# Patient Record
Sex: Male | Born: 1965 | Race: Black or African American | Hispanic: No | Marital: Single | Smoking: Current some day smoker
Health system: Southern US, Community
[De-identification: ages and names within clinical notes are randomized; demographics above are authoritative.]

## PROBLEM LIST (undated history)

## (undated) DIAGNOSIS — IMO0002 Reserved for concepts with insufficient information to code with codable children: Secondary | ICD-10-CM

## (undated) DIAGNOSIS — E119 Type 2 diabetes mellitus without complications: Secondary | ICD-10-CM

## (undated) DIAGNOSIS — R569 Unspecified convulsions: Secondary | ICD-10-CM

## (undated) DIAGNOSIS — M549 Dorsalgia, unspecified: Secondary | ICD-10-CM

## (undated) DIAGNOSIS — I10 Essential (primary) hypertension: Secondary | ICD-10-CM

---
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
## 1998-12-13 ENCOUNTER — Encounter: Payer: Self-pay | Admitting: Emergency Medicine

## 1998-12-13 ENCOUNTER — Emergency Department (HOSPITAL_COMMUNITY): Admission: EM | Admit: 1998-12-13 | Discharge: 1998-12-13 | Payer: Self-pay | Admitting: Emergency Medicine

## 2001-12-18 ENCOUNTER — Encounter: Payer: Self-pay | Admitting: Emergency Medicine

## 2001-12-18 ENCOUNTER — Emergency Department (HOSPITAL_COMMUNITY): Admission: EM | Admit: 2001-12-18 | Discharge: 2001-12-18 | Payer: Self-pay | Admitting: Emergency Medicine

## 2003-12-23 ENCOUNTER — Emergency Department (HOSPITAL_COMMUNITY): Admission: EM | Admit: 2003-12-23 | Discharge: 2003-12-23 | Payer: Self-pay

## 2009-09-02 ENCOUNTER — Emergency Department (HOSPITAL_COMMUNITY): Admission: EM | Admit: 2009-09-02 | Discharge: 2009-09-02 | Payer: Self-pay | Admitting: Emergency Medicine

## 2010-02-27 ENCOUNTER — Emergency Department (HOSPITAL_COMMUNITY): Admission: EM | Admit: 2010-02-27 | Discharge: 2010-02-27 | Payer: Self-pay | Admitting: Emergency Medicine

## 2010-03-06 ENCOUNTER — Emergency Department (HOSPITAL_COMMUNITY): Admission: EM | Admit: 2010-03-06 | Discharge: 2010-03-06 | Payer: Self-pay | Admitting: Family Medicine

## 2010-03-11 ENCOUNTER — Other Ambulatory Visit: Payer: Self-pay | Admitting: Orthopedic Surgery

## 2010-03-11 ENCOUNTER — Ambulatory Visit (HOSPITAL_COMMUNITY): Admission: RE | Admit: 2010-03-11 | Discharge: 2010-03-11 | Payer: Self-pay | Admitting: Orthopedic Surgery

## 2010-03-14 ENCOUNTER — Emergency Department (HOSPITAL_COMMUNITY): Admission: EM | Admit: 2010-03-14 | Discharge: 2010-03-14 | Payer: Self-pay | Admitting: Emergency Medicine

## 2010-03-25 ENCOUNTER — Emergency Department (HOSPITAL_COMMUNITY): Admission: EM | Admit: 2010-03-25 | Discharge: 2010-03-25 | Payer: Self-pay | Admitting: Emergency Medicine

## 2010-03-28 ENCOUNTER — Observation Stay (HOSPITAL_COMMUNITY): Admission: EM | Admit: 2010-03-28 | Discharge: 2010-03-29 | Payer: Self-pay | Admitting: Emergency Medicine

## 2010-04-01 ENCOUNTER — Inpatient Hospital Stay (HOSPITAL_COMMUNITY): Admission: AD | Admit: 2010-04-01 | Discharge: 2010-04-04 | Payer: Self-pay | Admitting: Internal Medicine

## 2010-04-09 ENCOUNTER — Observation Stay (HOSPITAL_COMMUNITY): Admission: EM | Admit: 2010-04-09 | Discharge: 2010-04-09 | Payer: Self-pay | Admitting: Emergency Medicine

## 2010-07-21 ENCOUNTER — Emergency Department (HOSPITAL_COMMUNITY)
Admission: EM | Admit: 2010-07-21 | Discharge: 2010-07-21 | Payer: Self-pay | Source: Home / Self Care | Admitting: Emergency Medicine

## 2010-07-31 ENCOUNTER — Emergency Department (HOSPITAL_COMMUNITY)
Admission: EM | Admit: 2010-07-31 | Discharge: 2010-07-31 | Payer: Self-pay | Source: Home / Self Care | Admitting: Emergency Medicine

## 2010-08-06 ENCOUNTER — Emergency Department (HOSPITAL_COMMUNITY)
Admission: EM | Admit: 2010-08-06 | Discharge: 2010-08-06 | Payer: Self-pay | Source: Home / Self Care | Admitting: Family Medicine

## 2010-10-24 LAB — CBC
HCT: 44.2 % (ref 39.0–52.0)
MCH: 34.6 pg — ABNORMAL HIGH (ref 26.0–34.0)
MCHC: 34.5 g/dL (ref 30.0–36.0)
MCHC: 33.8 g/dL (ref 30.0–36.0)
MCV: 100.2 fL — ABNORMAL HIGH (ref 78.0–100.0)
MCV: 102.3 fL — ABNORMAL HIGH (ref 78.0–100.0)
Platelets: 202 10*3/uL (ref 150–400)
Platelets: 223 10*3/uL (ref 150–400)
RBC: 4.13 MIL/uL — ABNORMAL LOW (ref 4.22–5.81)
RDW: 12.4 % (ref 11.5–15.5)
RDW: 13.3 % (ref 11.5–15.5)
WBC: 6.9 10*3/uL (ref 4.0–10.5)

## 2010-10-24 LAB — COMPREHENSIVE METABOLIC PANEL
Albumin: 3.5 g/dL (ref 3.5–5.2)
Calcium: 8.8 mg/dL (ref 8.4–10.5)
Creatinine, Ser: 1.01 mg/dL (ref 0.4–1.5)
GFR calc Af Amer: >60 mL/min (ref >60)
Glucose, Bld: 193 mg/dL — ABNORMAL HIGH (ref 70–99)
Potassium: 4.1 mEq/L (ref 3.5–5.1)
Sodium: 138 mEq/L (ref 135–145)
Total Bilirubin: 0.8 mg/dL (ref 0.3–1.2)
Total Protein: 6.2 g/dL (ref 6.0–8.3)

## 2010-10-24 LAB — BASIC METABOLIC PANEL
BUN: 18 mg/dL (ref 6–23)
GFR calc non Af Amer: >60 mL/min (ref >60)
Sodium: 139 mEq/L (ref 135–145)

## 2010-10-24 LAB — GLUCOSE, CAPILLARY: Glucose-Capillary: 93 mg/dL (ref 70–99)

## 2010-11-10 ENCOUNTER — Encounter: Payer: Self-pay | Attending: Physical Medicine & Rehabilitation | Admitting: Physical Medicine & Rehabilitation

## 2011-04-14 ENCOUNTER — Encounter: Payer: Self-pay | Attending: Neurosurgery | Admitting: Neurosurgery

## 2011-05-11 ENCOUNTER — Ambulatory Visit: Payer: Self-pay | Admitting: Physical Medicine & Rehabilitation

## 2011-06-24 ENCOUNTER — Emergency Department (HOSPITAL_COMMUNITY)
Admission: EM | Admit: 2011-06-24 | Discharge: 2011-06-24 | Disposition: A | Discharge status: Home / Self Care | Payer: Self-pay | Attending: Emergency Medicine | Admitting: Emergency Medicine

## 2011-06-24 ENCOUNTER — Encounter: Payer: Self-pay | Admitting: *Deleted

## 2011-06-24 DIAGNOSIS — S0180XA Unspecified open wound of other part of head, initial encounter: Secondary | ICD-10-CM | POA: Insufficient documentation

## 2011-06-24 DIAGNOSIS — T148XXA Other injury of unspecified body region, initial encounter: Secondary | ICD-10-CM

## 2011-06-24 DIAGNOSIS — S0181XA Laceration without foreign body of other part of head, initial encounter: Secondary | ICD-10-CM

## 2011-06-24 DIAGNOSIS — IMO0002 Reserved for concepts with insufficient information to code with codable children: Secondary | ICD-10-CM | POA: Insufficient documentation

## 2011-06-24 HISTORY — DX: Dorsalgia, unspecified: M54.9

## 2011-06-24 MED ORDER — OXYCODONE-ACETAMINOPHEN 5-325 MG PO TABS
1.0000 | ORAL_TABLET | Freq: Once | ORAL | Status: AC
  Administered 2011-06-24: 1 via ORAL

## 2011-06-24 MED ORDER — OXYCODONE-ACETAMINOPHEN 5-325 MG PO TABS: 1.0000 | ORAL_TABLET | ORAL | Status: AC | PRN

## 2011-06-24 MED ORDER — TETANUS-DIPHTH-ACELL PERTUSSIS 5-2.5-18.5 LF-MCG/0.5 IM SUSP
0.5000 mL | Freq: Once | INTRAMUSCULAR | Status: AC
  Administered 2011-06-24: 0.5 mL via INTRAMUSCULAR
  Filled 2011-06-24: qty 0.5

## 2011-06-24 MED ORDER — OXYCODONE-ACETAMINOPHEN 5-325 MG PO TABS
ORAL_TABLET | ORAL | Status: AC
  Filled 2011-06-24: qty 1
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
## 2011-06-24 NOTE — ED Notes (Signed)
EDP at bedside suturing laceration ? ?
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
## 2011-06-24 NOTE — ED Notes (Signed)
Please call [employer] officer Sturm at [phone] if "it is more then just stitches"
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
## 2011-06-29 ENCOUNTER — Emergency Department (HOSPITAL_COMMUNITY)
Admission: EM | Admit: 2011-06-29 | Discharge: 2011-06-29 | Disposition: A | Discharge status: Home / Self Care | Payer: Self-pay | Attending: Emergency Medicine | Admitting: Emergency Medicine

## 2011-06-29 ENCOUNTER — Encounter (HOSPITAL_COMMUNITY): Payer: Self-pay | Admitting: *Deleted

## 2011-06-29 DIAGNOSIS — IMO0002 Reserved for concepts with insufficient information to code with codable children: Secondary | ICD-10-CM

## 2011-06-29 DIAGNOSIS — Z4802 Encounter for removal of sutures: Secondary | ICD-10-CM | POA: Insufficient documentation

## 2011-06-29 MED ORDER — IBUPROFEN 800 MG PO TABS: 800.0000 mg | ORAL_TABLET | Freq: Three times a day (TID) | ORAL | Status: AC

## 2011-06-29 MED ORDER — HYDROCODONE-ACETAMINOPHEN 5-325 MG PO TABS: 1.0000 | ORAL_TABLET | Freq: Four times a day (QID) | ORAL | Status: AC | PRN

## 2011-06-29 NOTE — ED Notes (Signed)
To ed for suture removal from above left eyebrow. Pt states he has had white drainage from site. No redness noted.

## 2011-06-29 NOTE — ED Provider Notes (Signed)
Medical screening examination/treatment/procedure(s) were performed by non-physician practitioner and as supervising physician I was immediately available for consultation/collaboration.  Raeford Razor, MD 06/29/11 2212
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
## 2011-10-07 IMAGING — CR DG HAND COMPLETE 3+V*R*
3 series · 3 of 3 positions shown · non-contrast
Comparison: None.

CLINICAL DATA: Hand injury post fall

RIGHT HAND - COMPLETE 3+ VIEW

[view not recorded (1 of 3)]
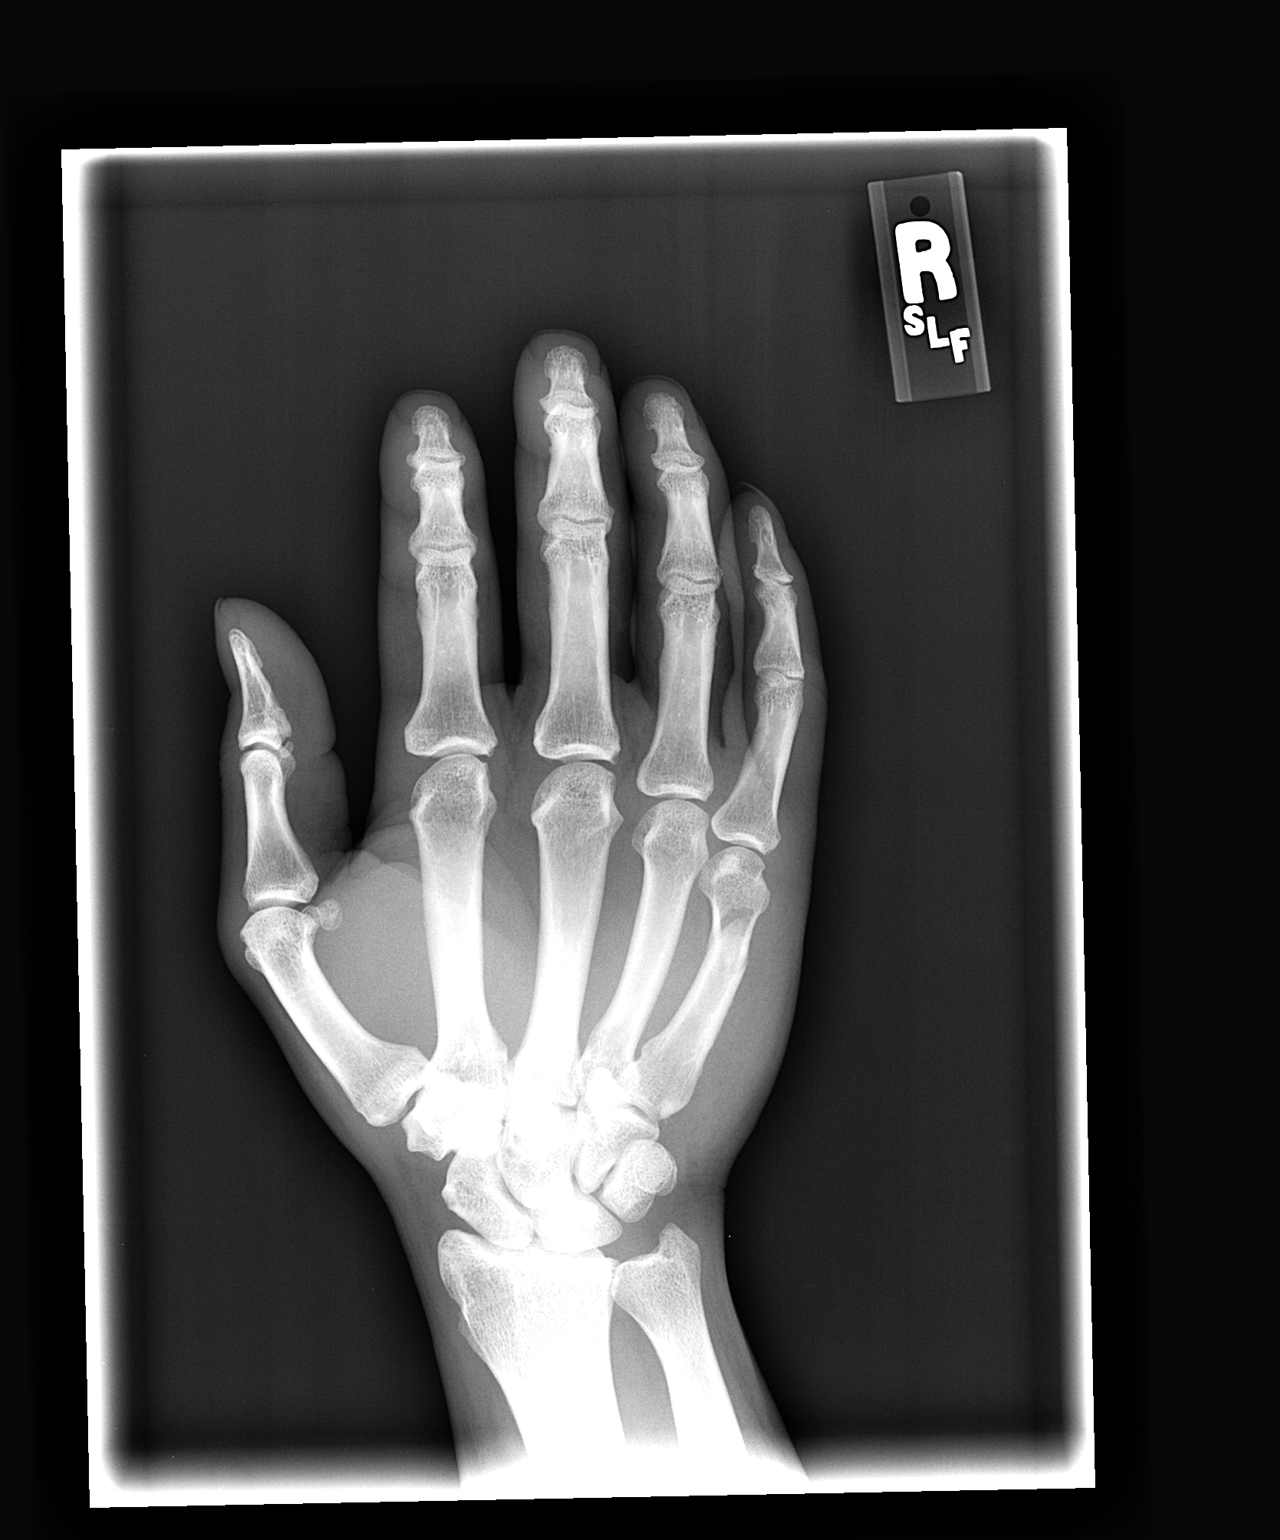

[view not recorded (2 of 3)]
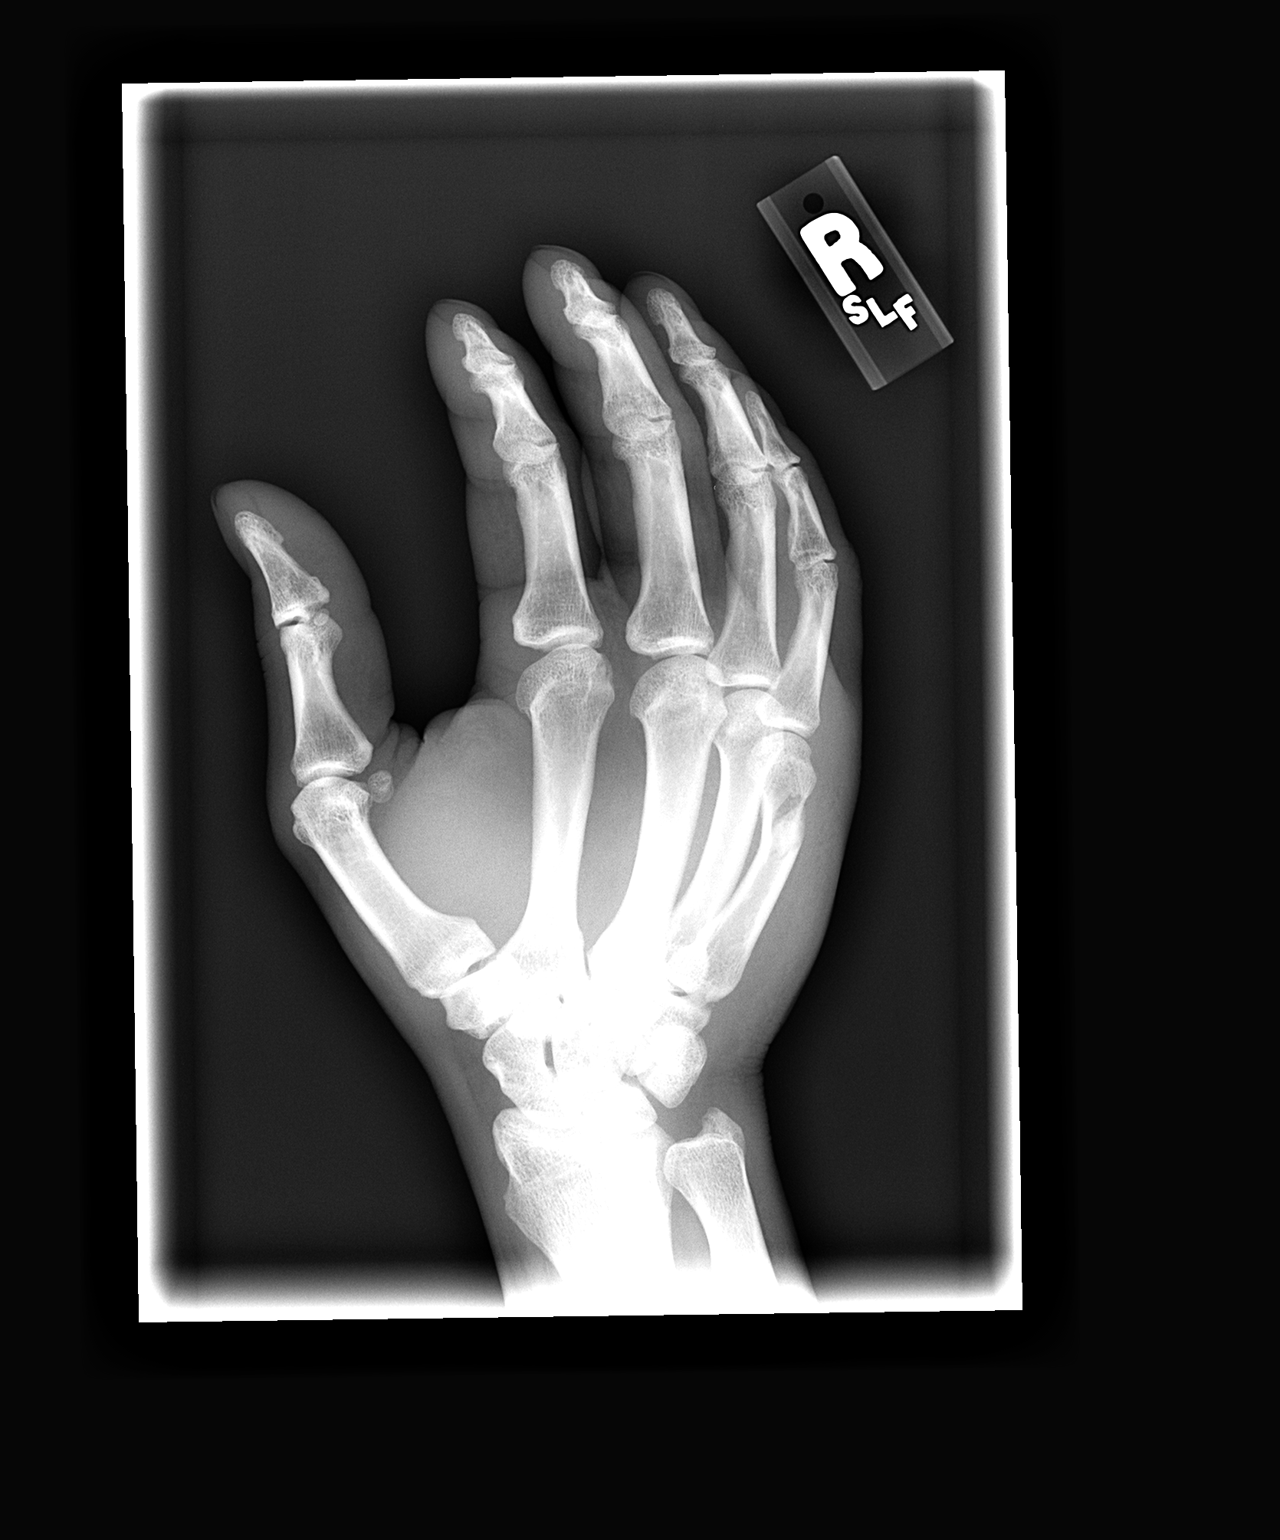

[view not recorded (3 of 3)]
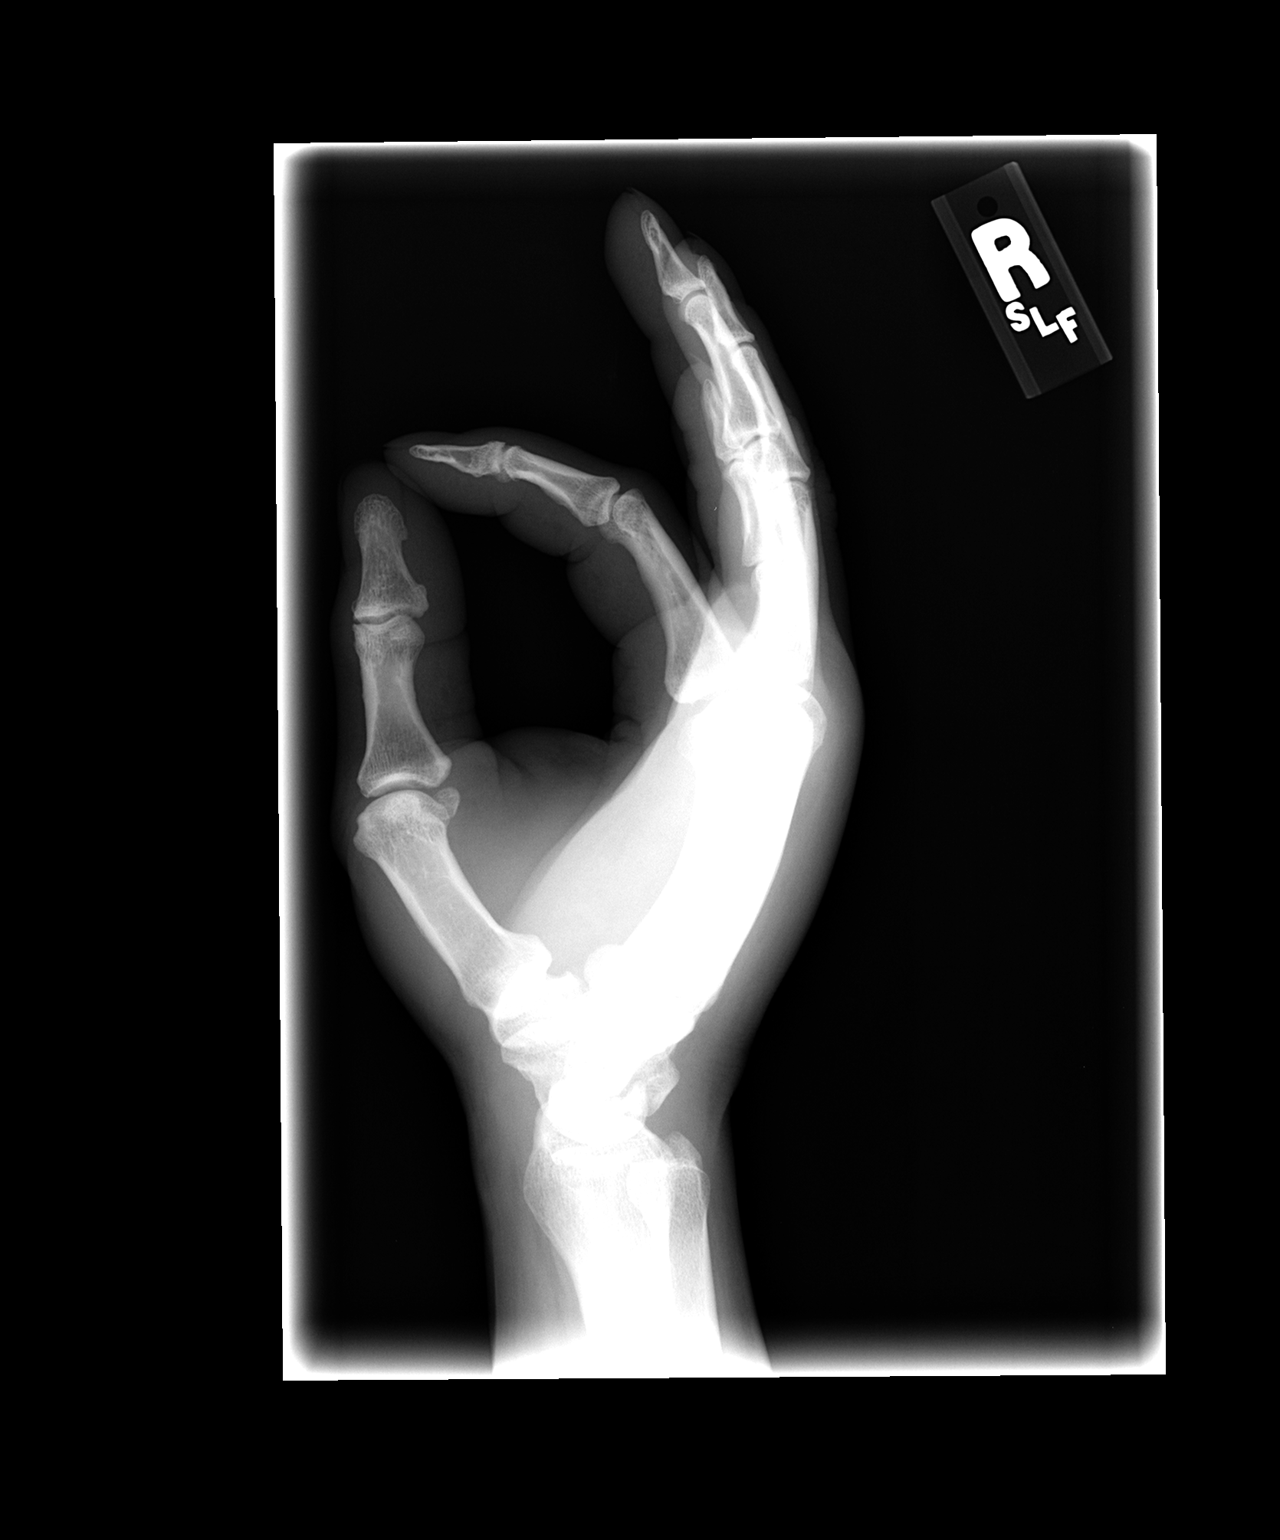

[3 of 3 positions shown; findings below may reference images not displayed]

FINDINGS: Three views of the right hand submitted.  There is mild
displaced fracture of the distal shaft right fifth metacarpal.
IMPRESSION: Mild displaced fracture distal shaft right fifth metacarpal.

## 2012-05-18 ENCOUNTER — Emergency Department (HOSPITAL_COMMUNITY)
Admission: EM | Admit: 2012-05-18 | Discharge: 2012-05-18 | Disposition: A | Discharge status: Home / Self Care | Payer: Self-pay | Attending: Emergency Medicine | Admitting: Emergency Medicine

## 2012-05-18 ENCOUNTER — Encounter (HOSPITAL_COMMUNITY): Payer: Self-pay | Admitting: Emergency Medicine

## 2012-05-18 DIAGNOSIS — Z880 Allergy status to penicillin: Secondary | ICD-10-CM | POA: Insufficient documentation

## 2012-05-18 DIAGNOSIS — G8929 Other chronic pain: Secondary | ICD-10-CM | POA: Insufficient documentation

## 2012-05-18 DIAGNOSIS — M5126 Other intervertebral disc displacement, lumbar region: Secondary | ICD-10-CM | POA: Insufficient documentation

## 2012-05-18 DIAGNOSIS — I10 Essential (primary) hypertension: Secondary | ICD-10-CM | POA: Insufficient documentation

## 2012-05-18 HISTORY — DX: Reserved for concepts with insufficient information to code with codable children: IMO0002

## 2012-05-18 HISTORY — DX: Essential (primary) hypertension: I10

## 2012-05-18 MED ORDER — DIAZEPAM 5 MG PO TABS: 5.0000 mg | ORAL_TABLET | Freq: Four times a day (QID) | ORAL | Status: DC | PRN

## 2012-05-18 MED ORDER — OXYCODONE-ACETAMINOPHEN 5-325 MG PO TABS
2.0000 | ORAL_TABLET | Freq: Once | ORAL | Status: AC
  Administered 2012-05-18: 2 via ORAL
  Filled 2012-05-18: qty 2

## 2012-05-18 MED ORDER — DIAZEPAM 5 MG PO TABS
5.0000 mg | ORAL_TABLET | Freq: Once | ORAL | Status: AC
  Administered 2012-05-18: 5 mg via ORAL
  Filled 2012-05-18: qty 1

## 2012-05-18 MED ORDER — HYDROMORPHONE HCL PF 2 MG/ML IJ SOLN: 2.0000 mg | Freq: Once | INTRAMUSCULAR | Status: DC

## 2012-05-18 MED ORDER — OXYCODONE-ACETAMINOPHEN 10-325 MG PO TABS: 1.0000 | ORAL_TABLET | ORAL | Status: DC | PRN

## 2012-05-18 NOTE — ED Provider Notes (Signed)
History     CSN: [id number]  Arrival date & time 05/18/12  0230   First MD Initiated Contact with Patient 05/18/12 0301      Chief Complaint  Patient presents with  . Back Pain   HPI  History provided by the patient. Patient is a 46 year old African American male with history of hypertension, herniated lumbar discs and chronic back pain who presents with complaints of chronic back pain symptoms. Patient states he's been dealing with low back pain for the past 2-3 years. He was a patient ulcer of with Dr. Mayford Knife and was seen by spine specialist in [place]. She was previously being treated with pain medications but he states he ran out several weeks ago. Patient has been using over-the-counter pain medications and occasionally using stronger pain medicines from his mother at times. Patient states yesterday he was spending some time raking leaves which seemed to increase his pain tonight thinking it difficult to sleep. Pain pain radiates down left lower extremity at baseline. Patient reports having some numbness to the lower left leg area that has been chronic. Patient also reports occasional episodic history of urinary incontinence. He denies any recent incontinence in the last several weeks. He denies any weakness in the legs. Denies any perineal numbness. Denies any urinary retention.    Past Medical History  Diagnosis Date  . Back pain   . Hypertension   . Herniated disc     History reviewed. No pertinent past surgical history.  No family history on file.  History  Substance Use Topics  . Smoking status: Never Smoker   . Smokeless tobacco: Never Used  . Alcohol Use: Yes      Review of Systems  Constitutional: Negative for fever, chills and unexpected weight change.  Respiratory: Negative for shortness of breath.   Gastrointestinal: Negative for vomiting, abdominal pain, diarrhea and constipation.  Genitourinary: Negative for dysuria, frequency, hematuria and flank  pain.  Musculoskeletal: Positive for back pain.  Skin: Negative for rash.  Neurological: Positive for numbness. Negative for weakness.    Allergies  Penicillins  Home Medications  No current outpatient prescriptions on file.  BP 140/114  Pulse 104  Temp 98.9 F (37.2 C) (Oral)  Resp 16  SpO2 94%  Physical Exam  Nursing note and vitals reviewed. Constitutional: He is oriented to person, place, and time. He appears well-developed and well-nourished.  HENT:  Head: Normocephalic.  Cardiovascular: Normal rate and regular rhythm.   Pulmonary/Chest: Effort normal and breath sounds normal.  Abdominal: Soft. There is no tenderness. There is no rebound, no guarding and no CVA tenderness.  Musculoskeletal:       Cervical back: Normal.       Thoracic back: Normal.       Lumbar back: He exhibits decreased range of motion and tenderness.       Back:  Neurological: He is alert and oriented to person, place, and time. He has normal strength.       Decreased sensation to light touch over anterior left shin area and great toe.  Normal sensation to light touch in 2-5 left toes.  Normal strength in foot and ankle.  Skin: Skin is warm.  Psychiatric: He has a normal mood and affect. His behavior is normal.    ED Course  Procedures     1. Chronic back pain       MDM  3:00AM  patient seen and evaluated. Patient appears in moderate discomfort. Symptoms have been chronic without any  significant acute changes.  Patient does have concerning past history of intermittent episodes of incontinence for the past year. No recent episodes in the last several weeks. I discussed with patient options for MRI and he refused. I discussed strong recommendations for the patient to followup with his spine specialist in [place] or to see a spine specialist here in [place]. I will provide referral at discharge. We'll also provide prescriptions for Percocet 10 mg and Valium 5 mg last for the next few  days.        Angus Seller, [place] 05/18/12 [phone]

## 2012-05-18 NOTE — ED Notes (Signed)
ED PA at BS 

## 2012-05-18 NOTE — ED Notes (Addendum)
PT. REPORTS CHRONIC LOW BACK PAIN FOR 2 1/2 YEARS , DENIES INJURY OR FALL , AMBULATORY , STATES HE RAN OUT OF PERCOCET AND VALIUM FOR SEVERAL DAYS , HISTORY OF HERNIATED DISC.

## 2012-05-18 NOTE — ED Notes (Signed)
Pt discharge to home.  Prescriptions given to pt to get filled.  Pt states much relief to meds given here.  Pt will f/u with pcp.  NAD

## 2012-05-18 NOTE — ED Notes (Addendum)
C/o L lower back pain, radiates down L leg. "have numbness all the time in L leg". Sx ongoing for last 3 yrs. Last episode ~ 3 months ago. Last MRI ~ 1 yr ago. Specialist in [place]. (Denies: weakness, nvd, fever). Sx exacerbated yesterday raking leaves. States, "has urinary frequency he thinks r/t hypoglycemia", admits to urinary and bowel incontinence several times, about twice a week.

## 2012-05-22 NOTE — ED Provider Notes (Signed)
Medical screening examination/treatment/procedure(s) were performed by non-physician practitioner and as supervising physician I was immediately available for consultation/collaboration.  Llesenia Fogal T Joycelin Radloff, MD 05/22/12 0857 

## 2012-07-22 ENCOUNTER — Emergency Department (HOSPITAL_COMMUNITY)
Admission: EM | Admit: 2012-07-22 | Discharge: 2012-07-22 | Disposition: A | Discharge status: Home / Self Care | Payer: No Typology Code available for payment source | Attending: Emergency Medicine | Admitting: Emergency Medicine

## 2012-07-22 ENCOUNTER — Emergency Department (HOSPITAL_COMMUNITY): Payer: No Typology Code available for payment source

## 2012-07-22 ENCOUNTER — Encounter (HOSPITAL_COMMUNITY): Payer: Self-pay | Admitting: Emergency Medicine

## 2012-07-22 DIAGNOSIS — M519 Unspecified thoracic, thoracolumbar and lumbosacral intervertebral disc disorder: Secondary | ICD-10-CM

## 2012-07-22 DIAGNOSIS — M5146 Schmorl's nodes, lumbar region: Secondary | ICD-10-CM | POA: Insufficient documentation

## 2012-07-22 DIAGNOSIS — R5381 Other malaise: Secondary | ICD-10-CM | POA: Insufficient documentation

## 2012-07-22 DIAGNOSIS — I10 Essential (primary) hypertension: Secondary | ICD-10-CM | POA: Insufficient documentation

## 2012-07-22 DIAGNOSIS — M545 Low back pain, unspecified: Secondary | ICD-10-CM | POA: Insufficient documentation

## 2012-07-22 DIAGNOSIS — R209 Unspecified disturbances of skin sensation: Secondary | ICD-10-CM | POA: Insufficient documentation

## 2012-07-22 DIAGNOSIS — M79609 Pain in unspecified limb: Secondary | ICD-10-CM | POA: Insufficient documentation

## 2012-07-22 DIAGNOSIS — M549 Dorsalgia, unspecified: Secondary | ICD-10-CM

## 2012-07-22 DIAGNOSIS — M48061 Spinal stenosis, lumbar region without neurogenic claudication: Secondary | ICD-10-CM | POA: Insufficient documentation

## 2012-07-22 DIAGNOSIS — M4800 Spinal stenosis, site unspecified: Secondary | ICD-10-CM

## 2012-07-22 DIAGNOSIS — R3919 Other difficulties with micturition: Secondary | ICD-10-CM | POA: Insufficient documentation

## 2012-07-22 MED ORDER — SENNOSIDES-DOCUSATE SODIUM 8.6-50 MG PO TABS: 1.0000 | ORAL_TABLET | Freq: Every day | ORAL | Status: DC

## 2012-07-22 MED ORDER — OXYCODONE-ACETAMINOPHEN 10-325 MG PO TABS: 1.0000 | ORAL_TABLET | ORAL | Status: DC | PRN

## 2012-07-22 MED ORDER — HYDROMORPHONE HCL PF 1 MG/ML IJ SOLN
1.0000 mg | Freq: Once | INTRAMUSCULAR | Status: AC
  Administered 2012-07-22: 1 mg via INTRAMUSCULAR
  Filled 2012-07-22: qty 1

## 2012-07-22 MED ORDER — DIAZEPAM 5 MG PO TABS
5.0000 mg | ORAL_TABLET | Freq: Once | ORAL | Status: AC
  Administered 2012-07-22: 5 mg via ORAL
  Filled 2012-07-22: qty 1
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
## 2012-07-22 NOTE — ED Notes (Signed)
Family at bedside. 

## 2012-07-22 NOTE — ED Notes (Signed)
Back pain x 3 years  Got bad  Day before yesterday no new injury has hx of pinched nerve

## 2012-07-22 NOTE — ED Notes (Addendum)
Care transferred and report given to Jennaya, RN 

## 2012-07-22 NOTE — ED Provider Notes (Signed)
3:50 PM Patient with a hx sig for chronic back pain was placed in CDU for Back pain control and pending MRI  by Dr. Fredderick Phenix. Patient care resumed from  PA PAZ .  Patient is here for MRI and has received Dilaudid and diazepam.  Patient re-evaluated and is resting comfortable, VSS, with no new complaints or concerns at this time. On exam: hemodynamically stable, NAD, heart w/ RRR, lungs CTAB, Chest & abd non-tender, no peripheral edema or calf tenderness.   MR Lumbar Spine Wo Contrast (Final result)   Result time:07/22/12 1457    Final result by Rad Results In Interface (07/22/12 14:57:58)    Narrative:   *RADIOLOGY REPORT*  Clinical Data: Chronic back pain radiating to legs bilaterally worse over past 3 days. Numbness left foot with weakness left lower extremity. Bowel incontinence.  MRI LUMBAR SPINE WITHOUT CONTRAST  Technique: Multiplanar and multiecho pulse sequences of the lumbar spine were obtained without intravenous contrast.  Comparison: 03/25/2010.  Findings: The present examination is limited to 2 sagittal sequences. The patient refused further imaging.  There is a transitional vertebra which has previously been labeled S1. This places a rudimentary disc at the S1-S2 level. Utilizing this level assignment, the present examination incorporates from the T10-T11 disc space to S2-3 level.  Conus just below the L1-2 disc space.  Since the prior examination, progressive endplate reactive changes surrounding the degenerated L4-5 disc. Findings are most suggestive of degenerative changes. In the proper clinical setting, infection could not be entirely excluded although there is only minimal increased signal in the degenerated disc.  Laterality of disc bulges/protrusions not able to be adequately assessed without axial imaging.  T10-11: Schmorl's node deformity. Bulge/protrusion with mild spinal stenosis and slight cord flattening.  T11-12: Schmorl's node deformity. Minimal  bulge.  T12-L1: Schmorl's node deformity.  L1-2: Moderate disc degeneration with disc space narrowing and Schmorl's node deformity with bulge/osteophyte with mild spinal stenosis.  L2-3: Minimal Schmorl's node deformity.  L3-4: Bulge/broad-based protrusion with minimal caudal extension and elevation of the posterior longitudinal ligament. Mild spinal stenosis and bilateral foraminal narrowing.  L4-5: Facet joint degenerative changes. Minimal anterior slip of L4. Prominent discogenic changes as noted above. Broad-based disc protrusion with greatest extension left foraminal position with compression of the exiting left L4 nerve root. This was noted previously. The right foraminal extension of disc protrusion has progressed since the prior examination with mild encroachment upon the exiting right L4 nerve root. Short pedicles. Spinal stenosis has progressed now appearing to be a moderate in degree.  L5-S1: Mild facet joint degenerative changes. Minimal bulge.  S1-S2: Rudimentary disc.  IMPRESSION: Exam limited to two sagittal sequences  Discogenic changes most prominent at the L4-5 level followed by the L3-4, L1-2 and T10-T11 level as detailed above.      Digital Rectal Exam reveals sphincter with good tone. No external hemorrhoids. No masses or fissures. Stool color is brown with no overt blood. Exam chaperoned by EMT Kathaleen Bury.  Patient will be discharged follow[facility] as [facility] is [facility] patient. Will d/c with his former pain med script.  F/U with Neurosurgery.  Patient has requested copy of his MRI results in hand.  I have provided him with a copy of this paper.    The patient is otherwise safe for discharge at this point  Filed Vitals:   07/22/12 1258 07/22/12 1452  BP: 158/112 139/64  Pulse: 104 71  Temp: 97.8 F (36.6 C) 98.9 F (37.2  C)  TempSrc:  Oral  Resp: 16 19  SpO2: 99% 95%    Arthor Captain, PA-C 07/23/12 2123

## 2012-07-22 NOTE — ED Provider Notes (Signed)
2:08 PM  Patient with a hx sig for hypertension and chronic back pain x3 years was placed in CDU on back pain protocol by Dr. Fredderick Phenix. Patient care resumed from fast track.  Note that patient has chronic left-sided weakness with new symptoms of bowel incontinence since October. Patient is here for pain management and MRI and has received 1 mg Dilaudid and 5 mg Valium. While in obeservation over night the pt slept well and had no complaints, per nursing staff. Patient re-evaluated and is resting comfortable, VSS, with no new complaints or concerns at this time. Plan per previous provider is to disposition patient once imaging results. On exam: hemodynamically stable, NAD, heart w/ RRR, lungs CTAB, Chest & abd non-tender, no peripheral edema or calf tenderness.   3:00 PM  Patient care to be resumed by Tiburcio Pea, PA-C. Patient history has been discussed with midlevel resuming care.   Jaci Carrel, [place] 07/23/12 1607

## 2012-07-22 NOTE — ED Provider Notes (Signed)
History  This chart was scribed for Rolan Bucco, MD by Shari Heritage, ED Scribe. The patient was seen in room TR08C/TR08C. Patient's care was started at 1303.  CSN: [id number]  Arrival date & time 07/22/12  1254   First MD Initiated Contact with Patient 07/22/12 1303      Chief Complaint  Patient presents with  . Back Pain    The history is provided by the patient. No language interpreter was used.    HPI Comments: Tom Conley is a 46 y.o. male with a history of chronic back pain and hypertension who presents to the Emergency Department complaining of moderate to severe, constant, lower back pain that radiates down his legs bilaterally. Patient states that pain began worsening 3 days ago. Patient says that he has intermittent sharp pains in his legs. Patient says that certain movements and palpation aggravate the pain. There is associated numbness in the left foot, weakness of left lower extremity and bowel incontinence. Patient says that he had 1 bowel incontinence episode within the past 2 weeks but prior to that it was happening a a few times per week. Patient was on regular prescriptions of oxycodone and valium, but has been out of his medications for several weeks. Patient states that he came here on 05/18/12 and received pain medication that lasted for a few days and has been supplementing with Ibuprofen. Patient has never smoked. Formerly followed by a physician at [facility].  Past Medical History  Diagnosis Date  . Back pain   . Hypertension   . Herniated disc     History reviewed. No pertinent past surgical history.  No family history on file.  History  Substance Use Topics  . Smoking status: Never Smoker   . Smokeless tobacco: Never Used  . Alcohol Use: Yes      Review of Systems  Constitutional: Negative for fever, chills, diaphoresis and fatigue.  HENT: Negative for congestion, rhinorrhea and sneezing.   Eyes: Negative.   Respiratory: Negative for cough,  chest tightness and shortness of breath.   Cardiovascular: Negative for chest pain and leg swelling.  Gastrointestinal: Negative for nausea, vomiting, abdominal pain, diarrhea and blood in stool.  Genitourinary: Positive for difficulty urinating. Negative for frequency, hematuria and flank pain.  Musculoskeletal: Positive for back pain. Negative for arthralgias.  Skin: Negative for rash.  Neurological: Positive for weakness and numbness. Negative for dizziness, speech difficulty and headaches.    Allergies  Penicillins  Home Medications   Current Outpatient Rx  Name  Route  Sig  Dispense  Refill  . IBUPROFEN 200 MG PO TABS   Oral   Take 200 mg by mouth every 6 (six) hours as needed. For pain           Triage Vitals: BP 158/112  Pulse 104  Temp 97.8 F (36.6 C)  Resp 16  SpO2 99%  Physical Exam  Constitutional: He is oriented to person, place, and time. He appears well-developed and well-nourished.  HENT:  Head: Normocephalic and atraumatic.  Eyes: Pupils are equal, round, and reactive to light.  Neck: Normal range of motion. Neck supple.       No pain in neck or back  Cardiovascular: Normal rate, regular rhythm and normal heart sounds.   Pulmonary/Chest: Effort normal and breath sounds normal. No respiratory distress. He has no wheezes. He has no rales. He exhibits no tenderness.  Abdominal: Soft. Bowel sounds are normal. There is no tenderness. There is no rebound and no  guarding.  Musculoskeletal: Normal range of motion. He exhibits no edema.       Tenderness along lower lumbar area bilaterally.   Lymphadenopathy:    He has no cervical adenopathy.  Neurological: He is alert and oriented to person, place, and time.       Positive straight leg raise on the left. Patellar reflexes symmetric. Good pulses in feet. Slight weakness in the left foot as compared to the right. Decreased sensation to light touch in the left foot and left lower leg.  Skin: Skin is warm and dry.  No rash noted.  Psychiatric: He has a normal mood and affect.    ED Course  Procedures (including critical care time) DIAGNOSTIC STUDIES: Oxygen Saturation is 99% on room air, normal by my interpretation.    COORDINATION OF CARE: 1:18 PM- Patient informed of current plan for treatment and evaluation and agrees with plan at this time.      Labs Reviewed - No data to display No results found.   1. Back pain       MDM  Will treat pain with dilaudid and valium.  Ordered MRI since pt reports new bowel incontinence.  Pt will move to CDU pending this      I personally performed the services described in this documentation, which was scribed in my presence.  The recorded information has been reviewed and considered.     Rolan Bucco, MD 07/22/12 [phone]

## 2012-07-25 NOTE — ED Provider Notes (Signed)
Medical screening examination/treatment/procedure(s) were conducted as a shared visit with non-physician practitioner(s) and myself.  I personally evaluated the patient during the encounter   Rolan Bucco, MD 07/25/12 [phone]

## 2012-07-25 NOTE — ED Provider Notes (Signed)
Medical screening examination/treatment/procedure(s) were conducted as a shared visit with non-physician practitioner(s) and myself.  I personally evaluated the patient during the encounter   Rolan Bucco, MD 07/25/12 0700

## 2012-08-17 ENCOUNTER — Emergency Department (HOSPITAL_COMMUNITY)
Admission: EM | Admit: 2012-08-17 | Discharge: 2012-08-17 | Disposition: A | Discharge status: Home / Self Care | Payer: No Typology Code available for payment source | Attending: Emergency Medicine | Admitting: Emergency Medicine

## 2012-08-17 ENCOUNTER — Encounter (HOSPITAL_COMMUNITY): Payer: Self-pay | Admitting: *Deleted

## 2012-08-17 ENCOUNTER — Encounter (HOSPITAL_COMMUNITY): Payer: Self-pay | Admitting: Emergency Medicine

## 2012-08-17 ENCOUNTER — Emergency Department (INDEPENDENT_AMBULATORY_CARE_PROVIDER_SITE_OTHER)
Admission: EM | Admit: 2012-08-17 | Discharge: 2012-08-17 | Disposition: A | Discharge status: Nursing Home (Includes ICF) | Payer: No Typology Code available for payment source | Source: Home / Self Care | Attending: Family Medicine | Admitting: Family Medicine

## 2012-08-17 ENCOUNTER — Other Ambulatory Visit: Payer: Self-pay

## 2012-08-17 ENCOUNTER — Emergency Department (HOSPITAL_COMMUNITY): Payer: No Typology Code available for payment source

## 2012-08-17 DIAGNOSIS — R509 Fever, unspecified: Secondary | ICD-10-CM | POA: Insufficient documentation

## 2012-08-17 DIAGNOSIS — R1110 Vomiting, unspecified: Secondary | ICD-10-CM

## 2012-08-17 DIAGNOSIS — R109 Unspecified abdominal pain: Secondary | ICD-10-CM

## 2012-08-17 DIAGNOSIS — M549 Dorsalgia, unspecified: Secondary | ICD-10-CM | POA: Insufficient documentation

## 2012-08-17 DIAGNOSIS — R569 Unspecified convulsions: Secondary | ICD-10-CM

## 2012-08-17 DIAGNOSIS — Z8739 Personal history of other diseases of the musculoskeletal system and connective tissue: Secondary | ICD-10-CM | POA: Insufficient documentation

## 2012-08-17 DIAGNOSIS — R1013 Epigastric pain: Secondary | ICD-10-CM | POA: Insufficient documentation

## 2012-08-17 DIAGNOSIS — R197 Diarrhea, unspecified: Secondary | ICD-10-CM | POA: Insufficient documentation

## 2012-08-17 DIAGNOSIS — G8929 Other chronic pain: Secondary | ICD-10-CM | POA: Insufficient documentation

## 2012-08-17 DIAGNOSIS — R112 Nausea with vomiting, unspecified: Secondary | ICD-10-CM | POA: Insufficient documentation

## 2012-08-17 DIAGNOSIS — I10 Essential (primary) hypertension: Secondary | ICD-10-CM | POA: Insufficient documentation

## 2012-08-17 LAB — COMPREHENSIVE METABOLIC PANEL
ALT: 20 U/L (ref 0–53)
AST: 45 U/L — ABNORMAL HIGH (ref 0–37)
Alkaline Phosphatase: 108 U/L (ref 39–117)
CO2: 24 mEq/L (ref 19–32)
Calcium: 9.2 mg/dL (ref 8.4–10.5)
GFR calc non Af Amer: 52 mL/min — ABNORMAL LOW (ref >90)
Glucose, Bld: 122 mg/dL — ABNORMAL HIGH (ref 70–99)
Potassium: 3.5 mEq/L (ref 3.5–5.1)
Sodium: 134 mEq/L — ABNORMAL LOW (ref 135–145)
Total Protein: 7.9 g/dL (ref 6.0–8.3)

## 2012-08-17 LAB — CBC WITH DIFFERENTIAL/PLATELET
Basophils Absolute: 0 10*3/uL (ref 0.0–0.1)
Basophils Relative: 0 % (ref 0–1)
Eosinophils Absolute: 0 10*3/uL (ref 0.0–0.7)
HCT: 44.8 % (ref 39.0–52.0)
Hemoglobin: 15.6 g/dL (ref 13.0–17.0)
MCH: 33.8 pg (ref 26.0–34.0)
MCHC: 34.8 g/dL (ref 30.0–36.0)
Monocytes Absolute: 0.5 10*3/uL (ref 0.1–1.0)
Monocytes Relative: 8 % (ref 3–12)
RDW: 12.3 % (ref 11.5–15.5)

## 2012-08-17 LAB — RAPID URINE DRUG SCREEN, HOSP PERFORMED: Barbiturates: NOT DETECTED

## 2012-08-17 LAB — GLUCOSE, CAPILLARY: Glucose-Capillary: 145 mg/dL — ABNORMAL HIGH (ref 70–99)

## 2012-08-17 MED ORDER — SODIUM CHLORIDE 0.9 % IV BOLUS (SEPSIS): 2000.0000 mL | Freq: Once | INTRAVENOUS | Status: DC

## 2012-08-17 MED ORDER — LOPERAMIDE HCL 2 MG PO CAPS
4.0000 mg | ORAL_CAPSULE | Freq: Once | ORAL | Status: AC
  Administered 2012-08-17: 4 mg via ORAL
  Filled 2012-08-17: qty 2

## 2012-08-17 NOTE — ED Provider Notes (Signed)
History     CSN: [id number]  Arrival date & time 08/17/12  1143   First MD Initiated Contact with Patient 08/17/12 1216      Chief Complaint  Patient presents with  . Influenza  . Diarrhea  . Emesis    (Consider location/radiation/quality/duration/timing/severity/associated sxs/prior treatment) HPI Complains of diarrhea and epigastric pain onset 2 days ago multiple episodes of diarrhea patient vomited one time while being evaluated at [facility] earlier today patient reportedly had 2 "seizures" while at the urgent care Center where he briefly became unconscious while sitting in a chair maximum temperature 103 degrees. No blood per rectum no other complaint no treatment prior to coming here. Further history from Ms. Sophia, physician's Asst. urgent care patient had 2 seizures while at urgent care prior to being formally evaluated. She witnessed the second one which lasted' 30 seconds followed by brief postictal period Past Medical History  Diagnosis Date  . Back pain   . Hypertension   . Herniated disc    chronic back pain History reviewed. No pertinent past surgical history.  No family history on file.  History  Substance Use Topics  . Smoking status: Never Smoker   . Smokeless tobacco: Never Used  . Alcohol Use: Yes      Review of Systems  Constitutional: Positive for fever.  HENT: Negative.   Respiratory: Negative.   Cardiovascular: Negative.   Gastrointestinal: Positive for nausea, abdominal pain and diarrhea. Negative for blood in stool.  Musculoskeletal: Negative.   Skin: Negative.   Neurological: Positive for seizures.  Hematological: Negative.   Psychiatric/Behavioral: Negative.     Allergies  Penicillins  Home Medications   Current Outpatient Rx  Name  Route  Sig  Dispense  Refill  . TYLENOL COLD MULTI-SYMPTOM DAY PO   Oral   Take 2 capsules by mouth every 4 (four) hours as needed. For cold symptoms         .  OXYCODONE-ACETAMINOPHEN 10-325 MG PO TABS   Oral   Take 1 tablet by mouth every 4 (four) hours as needed for pain.   30 tablet   0     BP 116/73  Pulse 83  Temp 99.1 F (37.3 C) (Oral)  Resp 17  SpO2 97%  Physical Exam  Nursing note and vitals reviewed. Constitutional: He appears well-developed and well-nourished.  HENT:  Head: Normocephalic and atraumatic.  Eyes: Conjunctivae normal are normal. Pupils are equal, round, and reactive to light.  Neck: Neck supple. No tracheal deviation present. No thyromegaly present.  Cardiovascular: Normal rate and regular rhythm.   No murmur heard. Pulmonary/Chest: Effort normal and breath sounds normal.  Abdominal: Soft. Bowel sounds are normal. He exhibits no distension. There is no tenderness.  Musculoskeletal: Normal range of motion. He exhibits no edema and no tenderness.  Neurological: He is alert. He has normal reflexes. No cranial nerve deficit. Coordination normal.  Skin: Skin is warm and dry. No rash noted.  Psychiatric: He has a normal mood and affect.    ED Course  Procedures (including critical care time)   Labs Reviewed  COMPREHENSIVE METABOLIC PANEL  CBC WITH DIFFERENTIAL   No results found.   No diagnosis found.   Date: 08/17/2012  Rate: 85  Rhythm: normal sinus rhythm  QRS Axis: normal  Intervals: normal  ST/T Wave abnormalities: nonspecific ST/T changes  Conduction Disutrbances:none  Narrative Interpretation: Asymmetrically inverted T waves in inferolateral leads felt to be nonspecific  Old EKG Reviewed: none available  Results for orders placed during the hospital encounter of 08/17/12  COMPREHENSIVE METABOLIC PANEL      Component Value Range   Sodium 134 (*) 135 - 145 mEq/L   Potassium 3.5  3.5 - 5.1 mEq/L   Chloride 96  96 - 112 mEq/L   CO2 24  19 - 32 mEq/L   Glucose, Bld 122 (*) 70 - 99 mg/dL   BUN 15  6 - 23 mg/dL   Creatinine, Ser 1.61 (*) 0.50 - 1.35 mg/dL   Calcium 9.2  8.4 - 09.6 mg/dL    Total Protein 7.9  6.0 - 8.3 g/dL   Albumin 3.9  3.5 - 5.2 g/dL   AST 45 (*) 0 - 37 U/L   ALT 20  0 - 53 U/L   Alkaline Phosphatase 108  39 - 117 U/L   Total Bilirubin 0.4  0.3 - 1.2 mg/dL   GFR calc non Af Amer 52 (*) >90 mL/min   GFR calc Af Amer 60 (*) >90 mL/min  CBC WITH DIFFERENTIAL      Component Value Range   WBC 6.2  4.0 - 10.5 K/uL   RBC 4.62  4.22 - 5.81 MIL/uL   Hemoglobin 15.6  13.0 - 17.0 g/dL   HCT 04.5  40.9 - 81.1 %   MCV 97.0  78.0 - 100.0 fL   MCH 33.8  26.0 - 34.0 pg   MCHC 34.8  30.0 - 36.0 g/dL   RDW 91.4  78.2 - 95.6 %   Platelets 125 (*) 150 - 400 K/uL   Neutrophils Relative 79 (*) 43 - 77 %   Neutro Abs 4.9  1.7 - 7.7 K/uL   Lymphocytes Relative 13  12 - 46 %   Lymphs Abs 0.8  0.7 - 4.0 K/uL   Monocytes Relative 8  3 - 12 %   Monocytes Absolute 0.5  0.1 - 1.0 K/uL   Eosinophils Relative 0  0 - 5 %   Eosinophils Absolute 0.0  0.0 - 0.7 K/uL   Basophils Relative 0  0 - 1 %   Basophils Absolute 0.0  0.0 - 0.1 K/uL  URINE RAPID DRUG SCREEN (HOSP PERFORMED)      Component Value Range   Opiates NONE DETECTED  NONE DETECTED   Cocaine POSITIVE (*) NONE DETECTED   Benzodiazepines NONE DETECTED  NONE DETECTED   Amphetamines NONE DETECTED  NONE DETECTED   Tetrahydrocannabinol POSITIVE (*) NONE DETECTED   Barbiturates NONE DETECTED  NONE DETECTED   Dg Chest 2 View Chest xray reviewed by me 08/17/2012  *RADIOLOGY REPORT*  Clinical Data: Cough, body aches and fever.  CHEST - 2 VIEW  Comparison: None.  Findings: Trachea is midline.  Heart size normal.  There may be mild volume loss at the lung bases.  Lungs are otherwise clear.  No pleural fluid.  IMPRESSION: Suspect mild bibasilar volume loss on the frontal view.   Original Report Authenticated By: Leanna Battles, M.D.    Mr Lumbar Spine Wo Contrast  07/22/2012  *RADIOLOGY REPORT*  Clinical Data: Chronic back pain radiating to legs bilaterally worse over past 3 days.  Numbness left foot with weakness left lower  extremity.  Bowel incontinence.  MRI LUMBAR SPINE WITHOUT CONTRAST  Technique:  Multiplanar and multiecho pulse sequences of the lumbar spine were obtained without intravenous contrast.  Comparison: 03/25/2010.  Findings: The present examination is limited to 2 sagittal sequences.  The patient refused further imaging.  There is a transitional vertebra which  has previously been labeled S1.  This places a rudimentary disc at the S1-S2 level.  Utilizing this level assignment, the present examination incorporates from the T10-T11 disc space to S2-3 level.  Conus just below the L1-2 disc space.  Since the prior examination, progressive endplate reactive changes surrounding the degenerated L4-5 disc.  Findings are most suggestive of degenerative changes.  In the proper clinical setting, infection could not be entirely excluded although there is only minimal increased signal in the degenerated disc.  Laterality of disc bulges/protrusions not able to be adequately assessed without axial imaging.  T10-11:  Schmorl's node deformity.  Bulge/protrusion with mild spinal stenosis and slight cord flattening.  T11-12:  Schmorl's node deformity.  Minimal bulge.  T12-L1:  Schmorl's node deformity.  L1-2:  Moderate disc degeneration with disc space narrowing and Schmorl's node deformity with bulge/osteophyte with mild spinal stenosis.  L2-3:  Minimal Schmorl's node deformity.  L3-4:  Bulge/broad-based protrusion with minimal caudal extension and elevation of the posterior longitudinal ligament.  Mild spinal stenosis and bilateral foraminal narrowing.  L4-5:  Facet joint degenerative changes. Minimal anterior slip of L4.  Prominent discogenic changes as noted above.  Broad-based disc protrusion with greatest extension left foraminal position with compression of the exiting left L4 nerve root.  This was noted previously.  The right foraminal extension of disc protrusion has progressed since the prior examination with mild encroachment  upon the exiting right L4 nerve root. Short pedicles.  Spinal stenosis has progressed now appearing to be a moderate in degree.  L5-S1:  Mild facet joint degenerative changes.  Minimal bulge.  S1-S2:  Rudimentary disc.  IMPRESSION: Exam limited to two sagittal sequences  Discogenic changes most prominent at the L4-5 level followed by the L3-4, L1-2 and T10-T11 level as detailed above.   Original Report Authenticated By: Lacy Duverney, M.D.     5:25 PM patient feels much improved after treatment with intravenous fluids. He is alert and ambulates with cane without difficulty. No longer lightheaded on standing. MDM  I feel that seizure may be secondary to substance abuse and cocaine I stress the patient Plan encourage by mouth hydration. Referral to outpatient drug treatment programs Referral to [facility] for seizure workup as outpatient. No driving allowed Referral resource guide to get a primary care physician Diagnoses #1 acute enteritis #2 polysubstance abuse 3 dehydration #4 new onset seizure #5 mild renal insufficiency       Doug Sou, MD 08/17/12 1734

## 2012-08-17 NOTE — ED Provider Notes (Signed)
History     CSN: [id number]  Arrival date & time 08/17/12  1123   First MD Initiated Contact with Patient 08/17/12 1128      Chief Complaint  Patient presents with  . Emesis    (Consider location/radiation/quality/duration/timing/severity/associated sxs/prior treatment) Patient is a 47 y.o. male presenting with vomiting. The history is provided by the patient.  Emesis  This is a new problem. The problem occurs 5 to 10 times per day. The problem has been gradually worsening. The emesis has an appearance of stomach contents. Associated symptoms include abdominal pain.  Pt at registration for urgent care and told staff he needed to sit down.   Pt sat in chair dropped his head and had a seizure.  Staff was able to keep pt from falling. Pt had a second episode and vomitted several times,  Pt complains of severe abdominal pain   Past Medical History  Diagnosis Date  . Back pain   . Hypertension   . Herniated disc     No past surgical history on file.  No family history on file.  History  Substance Use Topics  . Smoking status: Never Smoker   . Smokeless tobacco: Never Used  . Alcohol Use: Yes      Review of Systems  Gastrointestinal: Positive for vomiting and abdominal pain.  Neurological: Positive for seizures.  All other systems reviewed and are negative.    Allergies  Penicillins  Home Medications   Current Outpatient Rx  Name  Route  Sig  Dispense  Refill  . IBUPROFEN 200 MG PO TABS   Oral   Take 200 mg by mouth every 6 (six) hours as needed. For pain         . OXYCODONE-ACETAMINOPHEN 10-325 MG PO TABS   Oral   Take 1 tablet by mouth every 4 (four) hours as needed for pain.   30 tablet   0   . SENNOSIDES-DOCUSATE SODIUM 8.6-50 MG PO TABS   Oral   Take 1 tablet by mouth daily.   30 tablet   0     There were no vitals taken for this visit.  Physical Exam  Nursing note and vitals reviewed. Constitutional: He appears well-developed and  well-nourished. He appears distressed.  HENT:  Head: Normocephalic.  Eyes: Conjunctivae normal are normal. Pupils are equal, round, and reactive to light.  Neck: Normal range of motion.  Cardiovascular:       tachy  Pulmonary/Chest: Effort normal.  Abdominal: Soft.  Neurological: He is alert.  Skin: He is diaphoretic.  Psychiatric: He has a normal mood and affect.    ED Course  Procedures (including critical care time)  Labs Reviewed - No data to display No results found.   No diagnosis found.    MDM  Pt moved to stretcher,  IV NS,  Pt becoming more alert,   Pt able to give his name.  Pt placed on monitor.    [facility] called to transport pt to ED.        Lonia Skinner Crandall, [place] 08/17/12 1138

## 2012-08-17 NOTE — ED Notes (Signed)
Pt sitting in chair in hallway " I feel like I am going to pass out" assisted by denise juarez and e Rasheen Schewe. Pt had seizure like episode for approx. 30 sec - semi responsive stated name was Tom Conley. Pt vomited  And had second syncopal episode. Pt moved to stretcher, monitor applied, 20 g iv started - [facility] called for emergency transport.

## 2012-08-17 NOTE — ED Notes (Signed)
Pt given d/c teaching and follow up care instruction. Pt verbalizes understanding of instructions and has no further questions upon d/c. Pt does not appear to be in acute distress upon d/c.

## 2012-08-17 NOTE — ED Notes (Signed)
Pt transfer from [facility] via [facility]. Pt c/o emesis, diarrhea, ILI. Had "seizure" at urgent care, no post ictal phase. States temp was over 103 at home and has been taking tylenol to lower fever - lowest it got at home was 100.7. Current temp 99.1 oral.

## 2012-08-19 NOTE — ED Provider Notes (Signed)
Medical screening examination/treatment/procedure(s) were performed by non-physician practitioner and as supervising physician I was immediately available for consultation/collaboration.   MORENO-COLL,Hershell Brandl; MD   Shadman Tozzi Moreno-Coll, MD 08/19/12 0837 

## 2013-07-25 ENCOUNTER — Encounter (HOSPITAL_COMMUNITY): Payer: Self-pay | Admitting: Emergency Medicine

## 2013-07-25 ENCOUNTER — Emergency Department (HOSPITAL_COMMUNITY): Payer: Self-pay

## 2013-07-25 ENCOUNTER — Other Ambulatory Visit: Payer: Self-pay

## 2013-07-25 ENCOUNTER — Emergency Department (HOSPITAL_COMMUNITY)
Admission: EM | Admit: 2013-07-25 | Discharge: 2013-07-25 | Disposition: A | Discharge status: Home / Self Care | Payer: Self-pay | Attending: Emergency Medicine | Admitting: Emergency Medicine

## 2013-07-25 DIAGNOSIS — S0180XA Unspecified open wound of other part of head, initial encounter: Secondary | ICD-10-CM | POA: Insufficient documentation

## 2013-07-25 DIAGNOSIS — S0191XA Laceration without foreign body of unspecified part of head, initial encounter: Secondary | ICD-10-CM

## 2013-07-25 DIAGNOSIS — F1410 Cocaine abuse, uncomplicated: Secondary | ICD-10-CM | POA: Insufficient documentation

## 2013-07-25 DIAGNOSIS — F1210 Cannabis abuse, uncomplicated: Secondary | ICD-10-CM | POA: Insufficient documentation

## 2013-07-25 DIAGNOSIS — Y92009 Unspecified place in unspecified non-institutional (private) residence as the place of occurrence of the external cause: Secondary | ICD-10-CM | POA: Insufficient documentation

## 2013-07-25 DIAGNOSIS — Z880 Allergy status to penicillin: Secondary | ICD-10-CM | POA: Insufficient documentation

## 2013-07-25 DIAGNOSIS — W19XXXA Unspecified fall, initial encounter: Secondary | ICD-10-CM

## 2013-07-25 DIAGNOSIS — IMO0002 Reserved for concepts with insufficient information to code with codable children: Secondary | ICD-10-CM | POA: Insufficient documentation

## 2013-07-25 DIAGNOSIS — W1809XA Striking against other object with subsequent fall, initial encounter: Secondary | ICD-10-CM | POA: Insufficient documentation

## 2013-07-25 DIAGNOSIS — Z8739 Personal history of other diseases of the musculoskeletal system and connective tissue: Secondary | ICD-10-CM | POA: Insufficient documentation

## 2013-07-25 DIAGNOSIS — Y9389 Activity, other specified: Secondary | ICD-10-CM | POA: Insufficient documentation

## 2013-07-25 DIAGNOSIS — I10 Essential (primary) hypertension: Secondary | ICD-10-CM | POA: Insufficient documentation

## 2013-07-25 DIAGNOSIS — R569 Unspecified convulsions: Secondary | ICD-10-CM | POA: Insufficient documentation

## 2013-07-25 LAB — RAPID URINE DRUG SCREEN, HOSP PERFORMED
Amphetamines: NOT DETECTED
Tetrahydrocannabinol: POSITIVE — AB

## 2013-07-25 LAB — CBC WITH DIFFERENTIAL/PLATELET
Basophils Absolute: 0 10*3/uL (ref 0.0–0.1)
HCT: 42.7 % (ref 39.0–52.0)
Hemoglobin: 15.3 g/dL (ref 13.0–17.0)
Lymphocytes Relative: 25 % (ref 12–46)
Lymphs Abs: 2 10*3/uL (ref 0.7–4.0)
Monocytes Absolute: 0.8 10*3/uL (ref 0.1–1.0)
Monocytes Relative: 10 % (ref 3–12)
Neutro Abs: 4.7 10*3/uL (ref 1.7–7.7)
RBC: 4.25 MIL/uL (ref 4.22–5.81)
WBC: 7.8 10*3/uL (ref 4.0–10.5)

## 2013-07-25 LAB — URINE MICROSCOPIC-ADD ON

## 2013-07-25 LAB — COMPREHENSIVE METABOLIC PANEL
AST: 25 U/L (ref 0–37)
CO2: 23 mEq/L (ref 19–32)
Chloride: 102 mEq/L (ref 96–112)
Creatinine, Ser: 1.07 mg/dL (ref 0.50–1.35)
GFR calc non Af Amer: 82 mL/min — ABNORMAL LOW (ref >90)
Glucose, Bld: 115 mg/dL — ABNORMAL HIGH (ref 70–99)
Total Bilirubin: 0.3 mg/dL (ref 0.3–1.2)

## 2013-07-25 LAB — URINALYSIS, ROUTINE W REFLEX MICROSCOPIC
Glucose, UA: NEGATIVE mg/dL
Hgb urine dipstick: NEGATIVE
Ketones, ur: 15 mg/dL — AB
Leukocytes, UA: NEGATIVE
Protein, ur: 30 mg/dL — AB

## 2013-07-25 MED ORDER — LORAZEPAM 2 MG/ML IJ SOLN: 1.0000 mg | Freq: Once | INTRAMUSCULAR | Status: DC

## 2013-07-25 MED ORDER — MORPHINE SULFATE 4 MG/ML IJ SOLN
4.0000 mg | Freq: Once | INTRAMUSCULAR | Status: AC
  Administered 2013-07-25: 4 mg via INTRAVENOUS
  Filled 2013-07-25: qty 1

## 2013-07-25 NOTE — ED Notes (Signed)
Pt states he feels like he is going to have another seizure. Pt talking on the phone. Seizure pads in place. Dr. Piedad Climes informed.

## 2013-07-25 NOTE — Progress Notes (Signed)
Chaplain paged to ed.  Pt was in trauma bay with no family present at this time.  Chaplain will follow up if family arrives and requests or needs chaplain services.

## 2013-07-25 NOTE — ED Notes (Signed)
Patient wheeled to waiting room. Patient verbalized understanding for suture removal, also wrote this on patients discharge instructions. Patients belongings placed in belongings bag and given to patient.

## 2013-07-25 NOTE — ED Notes (Signed)
Pt. Is aware of needing an urine specimen 

## 2013-07-25 NOTE — ED Notes (Signed)
Pt. Is hungry, dinner tray ordered and gingerale given.

## 2013-07-25 NOTE — ED Notes (Signed)
Warm blankets applied

## 2013-07-25 NOTE — ED Provider Notes (Signed)
CSN: [id number]     Arrival date & time 07/25/13  1523 History   First MD Initiated Contact with Patient 07/25/13 1530     Chief Complaint  Patient presents with  . Trauma   (Consider location/radiation/quality/duration/timing/severity/associated sxs/prior Treatment) Patient is a 47 y.o. male presenting with trauma.  Trauma   Current symptoms:      Associated symptoms:            Reports seizures.            Denies abdominal pain, headache, nausea, neck pain and vomiting.   Tom Conley is a 47 y.o. male who presents to the emergency department for concern of a seizure and fall.  Patient reports that he was standing and the next thing he remembers was being in the ambulance.  EMS called by bystanders who report that patient simply fell to the ground.  Started shaking.  Patient lost control of his bladder during episode.  Hit head and had laceration to L eyebrow.  Patient reports a history of a seizure in January, but nothing was ever done about it.  Patient has history of cocaine abuse, but denies any recent use.  No other drug or alcohol use.  When EMS arrived, patient was agitated but oriented.  Patient refusing C-collar use.  They gave Versed PTA for concern of possible further seizure activity and agitation.  Patient now calm, alert, oriented, and feeling well.  Past Medical History  Diagnosis Date  . Back pain   . Hypertension   . Herniated disc    History reviewed. No pertinent past surgical history. History reviewed. No pertinent family history. History  Substance Use Topics  . Smoking status: Never Smoker   . Smokeless tobacco: Never Used  . Alcohol Use: Yes    Review of Systems  Constitutional: Negative for fever and chills.  HENT: Negative for congestion and sore throat.   Respiratory: Negative for cough.   Gastrointestinal: Negative for nausea, vomiting, abdominal pain, diarrhea and constipation.  Endocrine: Negative for polyuria.  Genitourinary: Negative for  dysuria and hematuria.  Musculoskeletal: Negative for neck pain.  Skin: Negative for rash.  Neurological: Positive for seizures. Negative for dizziness, tremors, syncope, facial asymmetry, speech difficulty, weakness, light-headedness, numbness and headaches.  Psychiatric/Behavioral: Negative.   All other systems reviewed and are negative.    Allergies  Penicillins  Home Medications  No current outpatient prescriptions on file. BP 111/74  Pulse 91  Temp(Src) 98.1 F (36.7 C) (Oral)  Resp 19  SpO2 96% Physical Exam  Nursing note and vitals reviewed. Constitutional: He is oriented to person, place, and time. He appears well-developed and well-nourished. No distress.  HENT:  Head: Normocephalic and atraumatic.  Right Ear: External ear normal.  Left Ear: External ear normal.  Mouth/Throat: Oropharynx is clear and moist. No oropharyngeal exudate.  Eyes: Conjunctivae are normal. Pupils are equal, round, and reactive to light. Right eye exhibits no discharge.  Neck: Normal range of motion. Neck supple. No tracheal deviation present.  Cardiovascular: Normal rate, regular rhythm and intact distal pulses.   Pulmonary/Chest: Effort normal. No respiratory distress. He has no wheezes. He has no rales.  Abdominal: Soft. He exhibits no distension. There is no tenderness. There is no rebound and no guarding.  Musculoskeletal: Normal range of motion.  Neurological: He is alert and oriented to person, place, and time. He has normal strength and normal reflexes. No cranial nerve deficit or sensory deficit. He displays a negative Romberg sign. Coordination  normal. GCS eye subscore is 4. GCS verbal subscore is 5. GCS motor subscore is 6.  Skin: Skin is warm and dry. No rash noted. He is not diaphoretic.  Psychiatric: He has a normal mood and affect.    ED Course  LACERATION REPAIR Date/Time: 07/25/2013 11:39 PM Performed by: Arloa Koh Authorized by: Purvis Sheffield, S Consent: Verbal  consent obtained. Risks and benefits: risks, benefits and alternatives were discussed Consent given by: patient Body area: head/neck Location details: forehead Laceration length: 2 cm Tendon involvement: none Nerve involvement: none Vascular damage: no Local anesthetic: lidocaine 1% with epinephrine Anesthetic total: 5 ml Patient sedated: no Preparation: Patient was prepped and draped in the usual sterile fashion. Irrigation solution: saline Irrigation method: syringe Amount of cleaning: standard Debridement: none Degree of undermining: none Skin closure: 6-0 nylon Number of sutures: 3 Technique: simple Approximation: close Approximation difficulty: simple Patient tolerance: Patient tolerated the procedure well with no immediate complications.   (including critical care time) Labs Review Labs Reviewed  CBC WITH DIFFERENTIAL - Abnormal; Notable for the following:    MCV 100.5 (*)    MCH 36.0 (*)    All other components within normal limits  COMPREHENSIVE METABOLIC PANEL - Abnormal; Notable for the following:    Glucose, Bld 115 (*)    GFR calc non Af Amer 82 (*)    All other components within normal limits  ETHANOL  URINALYSIS, ROUTINE W REFLEX MICROSCOPIC  URINE RAPID DRUG SCREEN (HOSP PERFORMED)   Imaging Review Dg Chest 1 View  07/25/2013   CLINICAL DATA:  47 year old male trauma, seizure, fall. Left chest and rib pain. Initial encounter.  EXAM: CHEST - 1 VIEW  COMPARISON:  08/17/2012.  FINDINGS: Portable AP supine view at 1644 hrs. Lower lung volumes. Normal cardiac size and mediastinal contours. Visualized tracheal air column is within normal limits. There is mild streaky right suprahilar opacity. Otherwise no pneumothorax, pleural effusion, edema, or confluent pulmonary opacity. Bone mineralization is within normal limits. No displaced left rib fracture identified.  IMPRESSION: Lower lung volumes with mild streaky right suprahilar opacity, nonspecific and may reflect  atelectasis. Aspiration also is possible in this setting. No displaced rib fracture identified.   Electronically Signed   By: Augusto Gamble M.D.   On: 07/25/2013 16:54   Ct Head Wo Contrast  07/25/2013   CLINICAL DATA:  Unwitnessed seizure  EXAM: CT HEAD WITHOUT CONTRAST  TECHNIQUE: Contiguous axial images were obtained from the base of the skull through the vertex without intravenous contrast.  COMPARISON:  12/23/2003  FINDINGS: There is no evidence of mass effect, midline shift or extra-axial fluid collections. There is no evidence of a space-occupying lesion or intracranial hemorrhage. There is no evidence of a cortical-based area of acute infarction.  The ventricles and sulci are appropriate for the patient's age. The basal cisterns are patent.  Visualized portions of the orbits are unremarkable. The visualized portions of the paranasal sinuses and mastoid air cells are unremarkable.  The osseous structures are unremarkable.  IMPRESSION: No acute intracranial pathology.   Electronically Signed   By: Elige Ko   On: 07/25/2013 16:34    EKG Interpretation    Date/Time:  Tuesday July 25 2013 15:32:40 EST Ventricular Rate:  95 PR Interval:  128 QRS Duration: 96 QT Interval:  368 QTC Calculation: 463 R Axis:   86 Text Interpretation:  Age not entered, assumed to be  47 years old for purpose of ECG interpretation Sinus rhythm Nonspecific T abnormalities, inferior  leads Baseline wander in lead(s) V4 No significant change since last tracing Confirmed by HARRISON  MD, FORREST ([id number]) on 07/25/2013 3:54:55 PM            MDM   1. Fall, initial encounter   2. Cocaine abuse   3. Marijuana abuse   4. Seizure   5. Laceration of head, initial encounter     Tom Conley is a 47 y.o. male with history of substance abuse presents to the emergency department after a seizure.  Exam notable for laceration above L eyebrow.  Eyebrow lac closed.  No evidence of other trauma.  Patient remained at  neurologic baseline throughout stay in ED.  Patient ambulatory.  Reexamined and without further pains.  Patient instructed to stop driving and operating heavy machinery and to avoid bodies of water/heights.  Safe for discharge home.  Recommend f/u with PCP and neurology.  ED in 7 days for suture removal.  Stressed importance of stopping drugs.  Patient discharged.    Arloa Koh, MD 07/26/13 [phone]

## 2013-07-25 NOTE — ED Notes (Signed)
Pt. Transferred to Rm 33, Pt. Is stable alert and oriented X4.  No s/s of seizure activity.

## 2013-07-25 NOTE — ED Notes (Signed)
RES at bedside speaking with patient regarding discharge instructions

## 2013-07-25 NOTE — ED Notes (Addendum)
Pt to department via EMS- pt reports that he had an unwitnessed seizure at home. Sister came to the house and called for EMS. Pt c/o head and left chest pain pain. 5mg  Versed given via EMS. Pt refused to be board or collared.

## 2013-07-25 NOTE — ED Notes (Signed)
Assisted patient into paper scrubs. Patient given Gingerale. Mother has been called and will provide patient a ride.

## 2013-07-26 NOTE — ED Provider Notes (Signed)
Medical screening examination/treatment/procedure(s) were conducted as a shared visit with resident physician and myself.  I personally evaluated the patient during the encounter.    EKG Interpretation    Date/Time:  Tuesday July 25 2013 15:32:40 EST Ventricular Rate:  95 PR Interval:  128 QRS Duration: 96 QT Interval:  368 QTC Calculation: 463 R Axis:   86 Text Interpretation:  Age not entered, assumed to be  47 years old for purpose of ECG interpretation Sinus rhythm Nonspecific T abnormalities, inferior leads Baseline wander in lead(s) V4 No significant change since last tracing Confirmed by Ameriah Lint  MD, Tashona Calk ([id number]) on 07/25/2013 3:54:55 PM            I was present for and supervised the laceration repair performed by the resident physician.   I interviewed and examined the patient. Lungs are CTAB. Cardiac exam wnl. Abdomen soft. Pt back to baseline. Imaging non-contrib. He continues to appear well. Will rec neuro outpt f/u.   Junius Argyle, MD 07/26/13 1329

## 2013-07-27 ENCOUNTER — Emergency Department (HOSPITAL_COMMUNITY)
Admission: EM | Admit: 2013-07-27 | Discharge: 2013-07-27 | Disposition: A | Discharge status: Home / Self Care | Payer: Self-pay | Attending: Emergency Medicine | Admitting: Emergency Medicine

## 2013-07-27 ENCOUNTER — Encounter (HOSPITAL_COMMUNITY): Payer: Self-pay | Admitting: Emergency Medicine

## 2013-07-27 ENCOUNTER — Emergency Department (HOSPITAL_COMMUNITY): Payer: Self-pay

## 2013-07-27 DIAGNOSIS — S298XXA Other specified injuries of thorax, initial encounter: Secondary | ICD-10-CM | POA: Insufficient documentation

## 2013-07-27 DIAGNOSIS — G40909 Epilepsy, unspecified, not intractable, without status epilepticus: Secondary | ICD-10-CM | POA: Insufficient documentation

## 2013-07-27 DIAGNOSIS — M62830 Muscle spasm of back: Secondary | ICD-10-CM

## 2013-07-27 DIAGNOSIS — IMO0002 Reserved for concepts with insufficient information to code with codable children: Secondary | ICD-10-CM | POA: Insufficient documentation

## 2013-07-27 DIAGNOSIS — Y929 Unspecified place or not applicable: Secondary | ICD-10-CM | POA: Insufficient documentation

## 2013-07-27 DIAGNOSIS — S2249XA Multiple fractures of ribs, unspecified side, initial encounter for closed fracture: Secondary | ICD-10-CM | POA: Insufficient documentation

## 2013-07-27 DIAGNOSIS — K5900 Constipation, unspecified: Secondary | ICD-10-CM | POA: Insufficient documentation

## 2013-07-27 DIAGNOSIS — W1809XA Striking against other object with subsequent fall, initial encounter: Secondary | ICD-10-CM | POA: Insufficient documentation

## 2013-07-27 DIAGNOSIS — S2232XA Fracture of one rib, left side, initial encounter for closed fracture: Secondary | ICD-10-CM

## 2013-07-27 DIAGNOSIS — Y939 Activity, unspecified: Secondary | ICD-10-CM | POA: Insufficient documentation

## 2013-07-27 DIAGNOSIS — Z880 Allergy status to penicillin: Secondary | ICD-10-CM | POA: Insufficient documentation

## 2013-07-27 DIAGNOSIS — R42 Dizziness and giddiness: Secondary | ICD-10-CM | POA: Insufficient documentation

## 2013-07-27 DIAGNOSIS — Z79899 Other long term (current) drug therapy: Secondary | ICD-10-CM | POA: Insufficient documentation

## 2013-07-27 HISTORY — DX: Unspecified convulsions: R56.9

## 2013-07-27 LAB — COMPREHENSIVE METABOLIC PANEL
Alkaline Phosphatase: 106 U/L (ref 39–117)
BUN: 9 mg/dL (ref 6–23)
CO2: 27 mEq/L (ref 19–32)
Chloride: 105 mEq/L (ref 96–112)
GFR calc Af Amer: >90 mL/min (ref >90)
Glucose, Bld: 123 mg/dL — ABNORMAL HIGH (ref 70–99)
Potassium: 4.1 mEq/L (ref 3.5–5.1)
Total Bilirubin: 0.3 mg/dL (ref 0.3–1.2)

## 2013-07-27 LAB — POCT I-STAT TROPONIN I: Troponin i, poc: 0 ng/mL (ref 0.00–0.08)

## 2013-07-27 LAB — URINALYSIS, ROUTINE W REFLEX MICROSCOPIC
Glucose, UA: NEGATIVE mg/dL
Ketones, ur: 15 mg/dL — AB
Nitrite: NEGATIVE
Urobilinogen, UA: 0.2 mg/dL (ref 0.0–1.0)

## 2013-07-27 LAB — CBC WITH DIFFERENTIAL/PLATELET
Basophils Relative: 1 % (ref 0–1)
Eosinophils Relative: 4 % (ref 0–5)
HCT: 43.9 % (ref 39.0–52.0)
Hemoglobin: 15 g/dL (ref 13.0–17.0)
Lymphs Abs: 2.1 10*3/uL (ref 0.7–4.0)
MCH: 34.5 pg — ABNORMAL HIGH (ref 26.0–34.0)
MCHC: 34.2 g/dL (ref 30.0–36.0)
MCV: 100.9 fL — ABNORMAL HIGH (ref 78.0–100.0)
Monocytes Absolute: 0.7 10*3/uL (ref 0.1–1.0)
Monocytes Relative: 12 % (ref 3–12)
Neutro Abs: 3.2 10*3/uL (ref 1.7–7.7)
Neutrophils Relative %: 51 % (ref 43–77)
RBC: 4.35 MIL/uL (ref 4.22–5.81)

## 2013-07-27 LAB — LIPASE, BLOOD: Lipase: 22 U/L (ref 11–59)

## 2013-07-27 MED ORDER — IBUPROFEN 800 MG PO TABS
800.0000 mg | ORAL_TABLET | Freq: Once | ORAL | Status: AC
  Administered 2013-07-27: 800 mg via ORAL
  Filled 2013-07-27: qty 1

## 2013-07-27 MED ORDER — DIAZEPAM 5 MG PO TABS
5.0000 mg | ORAL_TABLET | Freq: Once | ORAL | Status: AC
  Administered 2013-07-27: 5 mg via ORAL
  Filled 2013-07-27: qty 1

## 2013-07-27 MED ORDER — DIAZEPAM 5 MG PO TABS: 5.0000 mg | ORAL_TABLET | Freq: Four times a day (QID) | ORAL | Status: DC | PRN

## 2013-07-27 MED ORDER — OXYCODONE-ACETAMINOPHEN 5-325 MG PO TABS
2.0000 | ORAL_TABLET | Freq: Once | ORAL | Status: AC
  Administered 2013-07-27: 2 via ORAL
  Filled 2013-07-27 (×2): qty 2

## 2013-07-27 MED ORDER — OXYCODONE-ACETAMINOPHEN 5-325 MG PO TABS: 1.0000 | ORAL_TABLET | Freq: Four times a day (QID) | ORAL | Status: DC | PRN

## 2013-07-27 MED ORDER — IBUPROFEN 800 MG PO TABS: 800.0000 mg | ORAL_TABLET | Freq: Four times a day (QID) | ORAL | Status: DC | PRN

## 2013-07-27 MED ORDER — POLYETHYLENE GLYCOL 3350 17 GM/SCOOP PO POWD: 17.0000 g | Freq: Two times a day (BID) | ORAL | Status: DC

## 2013-07-27 NOTE — ED Provider Notes (Signed)
Medical screening examination/treatment/procedure(s) were conducted as a shared visit with non-physician practitioner(s) and myself.  I personally evaluated the patient during the encounter.   Please see my separate note.     Candyce Churn, MD 07/27/13 [phone]

## 2013-07-27 NOTE — ED Notes (Signed)
Pt has multiple complaints. Reports being seen here recently for seizures, denies being given any prescriptions for his seizures. Pt still feels like he is going to have another seizure, feels "absent minded and lightheaded." pt reports falling during last seizure and now having mid and upper back pain and no bowel movement x 2 days and feels bloated.

## 2013-07-27 NOTE — ED Provider Notes (Signed)
Medical screening examination/treatment/procedure(s) were conducted as a shared visit with non-physician practitioner(s) and myself.  I personally evaluated the patient during the encounter.  EKG Interpretation   None       47 yo male with a few complaints, but primarily left thoracic back pain.  He states he has been constipated, was straining to have a bowel movement, and felt a sharp pain in his left thoracic paraspinal region.  Pain has been intense since then.  On exam, he is sitting upright, has left thoracic paraspinal tenderness with mild spasm.  Pain worse with twisting and turning.  Abd soft and minimally tender on left side.  I think his pain is related to a muscle spasm/sprain.  Will treat with NSAIDs, muscle relaxer.  Plain films ordered.  Clinical Impression: 1. Muscle spasm of back   2. Constipation      Candyce Churn, MD 07/27/13 2246

## 2013-07-27 NOTE — ED Notes (Signed)
Pt states he had a seizure 2 days ago and fell and hit his head. Stiches are present. Pt is now complaining of back pain below the left shoulder blade. Pt states the pain is so bad that it is difficult to breathe. Pain is rated 10/10

## 2013-07-27 NOTE — ED Provider Notes (Signed)
CSN: [id number]     Arrival date & time 07/27/13  1301 History   First MD Initiated Contact with Patient 07/27/13 1638     Chief Complaint  Patient presents with  . Back Pain  . Seizures  . Constipation   (Consider location/radiation/quality/duration/timing/severity/associated sxs/prior Treatment) HPI Comments: Patient is a 47 year old male past medical history significant for chronic back pain, hypertension, seizures presented to this department for multiple complaints. Patient's first complaint, complaint he states brought him into the emergency department, is being constipated for 3 days. Patient states his last bowel movement was on Monday. Patient states normally he goes to 3 times a day. States he feels bloated and is having some associated severe thoracic left sided back pain that began while attempting to have a bowel movement. Patient's second complaint is he is concerned he may have another seizure. Patient states he had 2 seizures two days ago and was brought to the ED here to be evaluated for. He had an negative workup at that time and did not have any further seizures while in the emergency department. Patient states he continues to feel lightheaded and "absent minded" he denies any further seizure activity. Patient states he does not take anything for his chronic back pain nor does he want to because of his liver.   Patient is a 46 y.o. male presenting with back pain, seizures, and constipation.  Back Pain Associated symptoms: abdominal pain   Associated symptoms: no fever   Seizures Constipation Associated symptoms: abdominal pain and back pain   Associated symptoms: no fever, no nausea and no vomiting     Past Medical History  Diagnosis Date  . Back pain   . Hypertension   . Herniated disc   . Seizures    History reviewed. No pertinent past surgical history. History reviewed. No pertinent family history. History  Substance Use Topics  . Smoking status: Never Smoker     . Smokeless tobacco: Never Used  . Alcohol Use: Yes    Review of Systems  Constitutional: Negative for fever and chills.  Respiratory: Negative for shortness of breath.   Gastrointestinal: Positive for abdominal pain and constipation. Negative for nausea and vomiting.  Musculoskeletal: Positive for back pain.  Neurological: Negative for syncope. Seizures: h/o seizure.  All other systems reviewed and are negative.    Allergies  Penicillins  Home Medications   Current Outpatient Rx  Name  Route  Sig  Dispense  Refill  . ibuprofen (ADVIL,MOTRIN) 200 MG tablet   Oral   Take 200-400 mg by mouth every 6 (six) hours as needed for moderate pain.         . diazepam (VALIUM) 5 MG tablet   Oral   Take 1 tablet (5 mg total) by mouth every 6 (six) hours as needed for muscle spasms.   10 tablet   0   . ibuprofen (ADVIL,MOTRIN) 800 MG tablet   Oral   Take 1 tablet (800 mg total) by mouth every 6 (six) hours as needed for mild pain or moderate pain.   21 tablet   0   . polyethylene glycol powder (GLYCOLAX/MIRALAX) powder   Oral   Take 17 g by mouth 2 (two) times daily. Until daily soft stools  OTC   255 g   0    BP 142/92  Pulse 69  Temp(Src) 97.9 F (36.6 C) (Oral)  Resp 18  SpO2 96% Physical Exam  Constitutional: He is oriented to person, place, and time. He  appears well-developed and well-nourished. No distress.  HENT:  Head: Normocephalic and atraumatic.  Right Ear: External ear normal.  Left Ear: External ear normal.  Nose: Nose normal.  Mouth/Throat: Oropharynx is clear and moist. No oropharyngeal exudate.  Eyes: Conjunctivae and EOM are normal. Pupils are equal, round, and reactive to light.  Neck: Normal range of motion. Neck supple.  Cardiovascular: Normal rate, regular rhythm, normal heart sounds and intact distal pulses.   Pulmonary/Chest: Effort normal and breath sounds normal. No respiratory distress.  Abdominal: Soft. Bowel sounds are normal. He  exhibits no distension. There is tenderness. There is no rebound and no guarding.  Musculoskeletal:       Cervical back: Normal.       Thoracic back: He exhibits normal range of motion, no tenderness, no swelling, no edema, no deformity and normal pulse.       Back:  Neurological: He is alert and oriented to person, place, and time. He has normal strength. No cranial nerve deficit or sensory deficit. Gait normal. GCS eye subscore is 4. GCS verbal subscore is 5. GCS motor subscore is 6.  No pronator drift. Bilateral heel-knee-shin intact.  Skin: Skin is warm and dry. He is not diaphoretic.    ED Course  Procedures (including critical care time) Labs Review Labs Reviewed  CBC WITH DIFFERENTIAL - Abnormal; Notable for the following:    MCV 100.9 (*)    MCH 34.5 (*)    All other components within normal limits  COMPREHENSIVE METABOLIC PANEL - Abnormal; Notable for the following:    Glucose, Bld 123 (*)    GFR calc non Af Amer 89 (*)    All other components within normal limits  URINALYSIS, ROUTINE W REFLEX MICROSCOPIC - Abnormal; Notable for the following:    Color, Urine AMBER (*)    APPearance CLOUDY (*)    Specific Gravity, Urine 1.036 (*)    Bilirubin Urine SMALL (*)    Ketones, ur 15 (*)    All other components within normal limits  LIPASE, BLOOD  POCT I-STAT TROPONIN I   Imaging Review No results found.  EKG Interpretation   None       MDM   1. Muscle spasm of back   2. Constipation    Afebrile, NAD, non-toxic appearing, AAOx4.   1) Muscle Spasm: Patient with back pain.  No neurological deficits and normal neuro exam.  Patient can walk but states is painful.  No loss of bowel or bladder control.  No concern for cauda equina.  No fever, night sweats, weight loss, h/o cancer, IVDU.  RICE protocol and muscle relaxant indicated and discussed with patient.   2) Constipation: Abdomen minimally tender, but soft w/o rebound, guarding, or rigidity. Acute abd/chest obtained  and is pending. Preliminary image review by myself reveals stool burden, will prescribe Miralax regimen.    Return precautions disucssed. Patient is agreeable to plan. Discharge pending abdominal film. Patient d/w with Dr. Loretha Stapler, agrees with plan. Signed out with Antony Madura, PA-C      Jeannetta Ellis, PA-C 07/27/13 2031

## 2013-07-27 NOTE — ED Provider Notes (Signed)
Patient care assumed from [facility], PA-C at shift change with DG Acute Abd pending.  Acute abdominal x-ray with chest significant for fractures of the posterior left 8th through 10th ribs. Ribs only slightly displaced. No pneumothorax or pleural effusion identified. I have consulted with radiologist, Dr. Geanie Cooley who endorses these fractures to be acute in nature. Fractures not seen on portable chest x-ray 2 days ago. Patient given Percocet and ED for his pain; will d/c with script for same as well as incentive spirometer. He is otherwise stable and appropriate for discharge with neurology followup; referrals to [facility] and [facility] neurology provided. Return precautions discussed and patient agreeable to plan with no unaddressed concerns.   Filed Vitals:   07/27/13 1329 07/27/13 1730  BP: 152/96 142/92  Pulse: 70 69  Temp: 97.9 F (36.6 C)   TempSrc: Oral   Resp: 18   SpO2: 97% 96%    Results for orders placed during the hospital encounter of 07/27/13  CBC WITH DIFFERENTIAL      Result Value Range   WBC 6.4  4.0 - 10.5 K/uL   RBC 4.35  4.22 - 5.81 MIL/uL   Hemoglobin 15.0  13.0 - 17.0 g/dL   HCT 16.1  09.6 - 04.5 %   MCV 100.9 (*) 78.0 - 100.0 fL   MCH 34.5 (*) 26.0 - 34.0 pg   MCHC 34.2  30.0 - 36.0 g/dL   RDW 40.9  81.1 - 91.4 %   Platelets 227  150 - 400 K/uL   Neutrophils Relative % 51  43 - 77 %   Neutro Abs 3.2  1.7 - 7.7 K/uL   Lymphocytes Relative 33  12 - 46 %   Lymphs Abs 2.1  0.7 - 4.0 K/uL   Monocytes Relative 12  3 - 12 %   Monocytes Absolute 0.7  0.1 - 1.0 K/uL   Eosinophils Relative 4  0 - 5 %   Eosinophils Absolute 0.3  0.0 - 0.7 K/uL   Basophils Relative 1  0 - 1 %   Basophils Absolute 0.1  0.0 - 0.1 K/uL  COMPREHENSIVE METABOLIC PANEL      Result Value Range   Sodium 140  135 - 145 mEq/L   Potassium 4.1  3.5 - 5.1 mEq/L   Chloride 105  96 - 112 mEq/L   CO2 27  19 - 32 mEq/L   Glucose, Bld 123 (*) 70 - 99 mg/dL   BUN 9  6 - 23 mg/dL   Creatinine, Ser 7.82  0.50 - 1.35 mg/dL   Calcium 9.2  8.4 - 95.6 mg/dL   Total Protein 7.4  6.0 - 8.3 g/dL   Albumin 3.5  3.5 - 5.2 g/dL   AST 15  0 - 37 U/L   ALT 12  0 - 53 U/L   Alkaline Phosphatase 106  39 - 117 U/L   Total Bilirubin 0.3  0.3 - 1.2 mg/dL   GFR calc non Af Amer 89 (*) >90 mL/min   GFR calc Af Amer >90  >90 mL/min  LIPASE, BLOOD      Result Value Range   Lipase 22  11 - 59 U/L  URINALYSIS, ROUTINE W REFLEX MICROSCOPIC      Result Value Range   Color, Urine AMBER (*) YELLOW   APPearance CLOUDY (*) CLEAR   Specific Gravity, Urine 1.036 (*) 1.005 - 1.030   pH 5.0  5.0 - 8.0   Glucose, UA NEGATIVE  NEGATIVE mg/dL  Hgb urine dipstick NEGATIVE  NEGATIVE   Bilirubin Urine SMALL (*) NEGATIVE   Ketones, ur 15 (*) NEGATIVE mg/dL   Protein, ur NEGATIVE  NEGATIVE mg/dL   Urobilinogen, UA 0.2  0.0 - 1.0 mg/dL   Nitrite NEGATIVE  NEGATIVE   Leukocytes, UA NEGATIVE  NEGATIVE  POCT I-STAT TROPONIN I      Result Value Range   Troponin i, poc 0.00  0.00 - 0.08 ng/mL   Comment 3            Dg Thoracic Spine 2 View  07/27/2013   CLINICAL DATA:  Posterior chest pain secondary to a recent seizure and secondary blunt trauma.  EXAM: THORACIC SPINE - 2 VIEW  COMPARISON:  Chest x-ray dated 08/17/2012  FINDINGS: There is no evidence of thoracic spine fracture. Alignment is normal. No other significant bone abnormalities are identified.  IMPRESSION: No significant abnormality.   Electronically Signed   By: Geanie Cooley M.D.   On: 07/27/2013 20:21   Dg Abd Acute W/chest  07/27/2013   CLINICAL DATA:  Abdominal pain.  EXAM: ACUTE ABDOMEN SERIES (ABDOMEN 2 VIEW & CHEST 1 VIEW)  COMPARISON:  Chest x-ray dated 08/17/2012 and lumbar radiograph dated 09/02/2009  FINDINGS: There are slightly displaced fractures of the 8th, 9th, and 10th left ribs. No pneumothorax or pleural effusion.  Heart size and vascularity are normal. Lungs are clear. No free air in the abdomen. Bowel gas pattern is  normal. Pelvis is not visible on this upright radiograph. The patient refused a lie down for the supine image because of the back pain.  IMPRESSION: Fractures of the posterior aspects of the left 8th, 9th, and 10th ribs. Otherwise benign appearing abdomen and chest.   Electronically Signed   By: Geanie Cooley M.D.   On: 07/27/2013 20:40      Antony Madura, PA-C 07/27/13 2105

## 2013-07-28 NOTE — ED Provider Notes (Signed)
Medical screening examination/treatment/procedure(s) were performed by non-physician practitioner and as supervising physician I was immediately available for consultation/collaboration.  EKG Interpretation    Date/Time:    Ventricular Rate:    PR Interval:    QRS Duration:   QT Interval:    QTC Calculation:   R Axis:     Text Interpretation:                Audree Camel, MD 07/28/13 [phone]

## 2013-08-04 ENCOUNTER — Encounter (HOSPITAL_COMMUNITY): Payer: Self-pay | Admitting: Emergency Medicine

## 2013-08-04 ENCOUNTER — Emergency Department (INDEPENDENT_AMBULATORY_CARE_PROVIDER_SITE_OTHER)
Admission: EM | Admit: 2013-08-04 | Discharge: 2013-08-04 | Disposition: A | Discharge status: Home / Self Care | Payer: Self-pay | Source: Home / Self Care | Attending: Emergency Medicine | Admitting: Emergency Medicine

## 2013-08-04 DIAGNOSIS — IMO0002 Reserved for concepts with insufficient information to code with codable children: Secondary | ICD-10-CM

## 2013-08-04 DIAGNOSIS — T148XXA Other injury of unspecified body region, initial encounter: Secondary | ICD-10-CM

## 2013-08-04 DIAGNOSIS — S2232XA Fracture of one rib, left side, initial encounter for closed fracture: Secondary | ICD-10-CM

## 2013-08-04 DIAGNOSIS — R569 Unspecified convulsions: Secondary | ICD-10-CM

## 2013-08-04 HISTORY — DX: Type 2 diabetes mellitus without complications: E11.9

## 2013-08-04 MED ORDER — OXYCODONE-ACETAMINOPHEN 5-325 MG PO TABS: ORAL_TABLET | ORAL | Status: DC

## 2013-08-04 NOTE — ED Provider Notes (Signed)
Chief Complaint:   Chief Complaint  Patient presents with  . Follow-up    History of Present Illness:   Tom Conley is a 47 year old male with a history of polysubstance abuse and seizures who was seen at the emergency room on December 17 because of a laceration just above his left eyebrow which he sustained following a seizure. This was repaired with 3 sutures. He had a head CT which was normal.  He returned to the emergency room the following day complaining of constipation. He was also found at that time to have 3 rib fractures with eighth, ninth, and 10th ribs. He was given Percocet for the pain and MiraLax as a laxative. Since then the laceration is been healing well with no evidence of infection. He denies any headache, visual, or neurological symptoms. He has not had any further seizure activity. He is not taking any medications for seizures right now. He has an appointment to see a neurologist in early January. He denies any stiff neck. He still having lots of pain in his left, lower, posterior lateral rib cage. It's worse with any kind of movement, twisting, bending, deep inspiration, coughing, or sneezing. He denies any hemoptysis or shortness of breath. He's had no bowel pain. He states his constipation is better.  Review of Systems:  Other than noted above, the patient denies any of the following symptoms: Systemic:  No fever or chills. Eye:  No eye pain, redness, diplopia or blurred vision ENT:  No bleeding from nose or ears.  No loose or broken teeth. Neck:  No pain or limited ROM. GI:  No nausea or vomiting. Neuro:  No loss of consciousness, seizure activity, numbness, tingling, or weakness.  PMFSH:  Past medical history, family history, social history, meds, and allergies were reviewed.   Physical Exam:   Vital signs:  BP 142/66  Pulse 82  Temp(Src) 98.2 F (36.8 C)  Resp 24  SpO2 99% General:  Alert and oriented times 3.  In no distress. Eye:  PERRL, full EOMs.  Lids  and conjunctivas normal. HEENT:  He has a laceration, just above his left eyebrow which is healing well. No other cranial or facial tenderness to palpation.  TMs and canals normal, nasal mucosa normal.  No oral lacerations.  Teeth were intact without obvious oral trauma. Neck:  Non tender.  Full ROM without pain. Lungs: Clear to auscultation. Heart: Regular rhythm, no gallop or murmur. Chest: There is exquisite tenderness to palpation over the left, lower, posterolateral rib cage area. Neurological:  Alert and oriented.  Cranial nerves intact.  No pronator drift.  No muscle weakness.  Sensation was intact to light touch. Gait was normal.  Procedure: Verbal informed consent was obtained.  The patient was informed of the risks and benefits of the procedure and understands and accepts.  Identity of the patient was verified verbally and by wristband. The laceration area was prepped with Betadine and the sutures were removed. The wound is healing well and there is no evidence of infection.  Assessment:  The primary encounter diagnosis was Laceration. Diagnoses of Rib fractures, left, closed, initial encounter and Seizures were also pertinent to this visit.  He was urged to followup with a neurologist with regard to his seizures.  Plan:   1.  Meds:  The following meds were prescribed:   New Prescriptions   OXYCODONE-ACETAMINOPHEN (PERCOCET) 5-325 MG PER TABLET    1 to 2 tablets every 6 hours as needed for pain.  2.  Patient Education/Counseling:  The patient was given appropriate handouts, self care instructions, and instructed in symptomatic relief. Instructions were given for wound care.  He was urged to avoid driving, heights, tub bathing, or swimming.  3.  Follow up:  The patient was told to follow up immediately if there is any sign of infection.The patient will followup with his neurologist for his seizures. He also needs a primary care physician and I gave him the name of the [facility] and [facility].      Reuben Likes, MD 08/04/13 1900

## 2013-08-04 NOTE — ED Notes (Signed)
Larey Seat last week and fractured 3 ribs on left. States he fell because he had a seizure.  Here for suture removal L eyebrow. Appears to be healing. No signs of infection.  States he needs more pain medicine also.

## 2013-08-08 ENCOUNTER — Ambulatory Visit (HOSPITAL_COMMUNITY): Payer: Self-pay | Attending: Emergency Medicine

## 2013-08-08 ENCOUNTER — Encounter (HOSPITAL_COMMUNITY): Payer: Self-pay | Admitting: Emergency Medicine

## 2013-08-08 ENCOUNTER — Emergency Department (INDEPENDENT_AMBULATORY_CARE_PROVIDER_SITE_OTHER)
Admission: EM | Admit: 2013-08-08 | Discharge: 2013-08-08 | Disposition: A | Discharge status: Home / Self Care | Payer: Self-pay | Source: Home / Self Care | Attending: Family Medicine | Admitting: Family Medicine

## 2013-08-08 DIAGNOSIS — R059 Cough, unspecified: Secondary | ICD-10-CM | POA: Insufficient documentation

## 2013-08-08 DIAGNOSIS — R05 Cough: Secondary | ICD-10-CM | POA: Insufficient documentation

## 2013-08-08 DIAGNOSIS — M5380 Other specified dorsopathies, site unspecified: Secondary | ICD-10-CM | POA: Insufficient documentation

## 2013-08-08 DIAGNOSIS — IMO0001 Reserved for inherently not codable concepts without codable children: Secondary | ICD-10-CM

## 2013-08-08 DIAGNOSIS — X58XXXA Exposure to other specified factors, initial encounter: Secondary | ICD-10-CM | POA: Insufficient documentation

## 2013-08-08 DIAGNOSIS — S2232XD Fracture of one rib, left side, subsequent encounter for fracture with routine healing: Secondary | ICD-10-CM

## 2013-08-08 DIAGNOSIS — S2249XA Multiple fractures of ribs, unspecified side, initial encounter for closed fracture: Secondary | ICD-10-CM | POA: Insufficient documentation

## 2013-08-08 MED ORDER — IBUPROFEN 800 MG PO TABS
ORAL_TABLET | ORAL | Status: AC
  Filled 2013-08-08: qty 1

## 2013-08-08 MED ORDER — AZITHROMYCIN 250 MG PO TABS: ORAL_TABLET | ORAL | Status: DC

## 2013-08-08 MED ORDER — HYDROCODONE-ACETAMINOPHEN 5-325 MG PO TABS
2.0000 | ORAL_TABLET | Freq: Once | ORAL | Status: AC
  Administered 2013-08-08: 2 via ORAL

## 2013-08-08 MED ORDER — IBUPROFEN 800 MG PO TABS
800.0000 mg | ORAL_TABLET | Freq: Once | ORAL | Status: AC
  Administered 2013-08-08: 800 mg via ORAL

## 2013-08-08 MED ORDER — OXYCODONE-ACETAMINOPHEN 10-325 MG PO TABS: 1.0000 | ORAL_TABLET | Freq: Four times a day (QID) | ORAL | Status: DC | PRN

## 2013-08-08 MED ORDER — HYDROCODONE-ACETAMINOPHEN 5-325 MG PO TABS
ORAL_TABLET | ORAL | Status: AC
  Filled 2013-08-08: qty 2

## 2013-08-08 MED ORDER — OXYCODONE-ACETAMINOPHEN 5-325 MG PO TABS: 1.0000 | ORAL_TABLET | ORAL | Status: DC | PRN

## 2013-08-08 NOTE — ED Provider Notes (Addendum)
CSN: [id number]     Arrival date & time 08/08/13  1349 History   First MD Initiated Contact with Patient 08/08/13 1553     Chief Complaint  Patient presents with  . Rib Injury  . Rash   (Consider location/radiation/quality/duration/timing/severity/associated sxs/prior Treatment) HPI Comments: A 47-year-old male with a history of polysubstance abuse, recent history of 3 rib fractures on the left side after a seizure on December 16 about 2 weeks ago, presents complaining of continued pain in the rib and a dry cough. The rib pain is not any better and is made worse with any activity. The cough started a couple days ago and has been getting gradually worse. He also states that he was never given an incentive spirometer as the notes indicated from his emergency department visit. Additionally, he admits to some very mild shortness of breath. No fever, chills, NVD. The last time he had any pain medicine was 2 days ago.  Patient is a 46 y.o. male presenting with rash.  Rash Associated symptoms: no abdominal pain, no diarrhea, no fatigue, no fever, no joint pain, no myalgias, no nausea, no shortness of breath, no sore throat and not vomiting     Past Medical History  Diagnosis Date  . Back pain   . Hypertension   . Herniated disc   . Seizures   . Diabetes mellitus without complication     hypoglycemic   Past Surgical History  Procedure Laterality Date  . Fracture arm Left 1975    ? plate on ulna   Family History  Problem Relation Age of Onset  . Diabetes Mother    History  Substance Use Topics  . Smoking status: Current Some Day Smoker -- 0.25 packs/day    Types: Cigarettes  . Smokeless tobacco: Never Used  . Alcohol Use: Yes    Review of Systems  Constitutional: Negative for fever, chills and fatigue.  HENT: Negative for sore throat.   Eyes: Negative for visual disturbance.  Respiratory: Negative for cough and shortness of breath.   Cardiovascular: Negative for chest pain,  palpitations and leg swelling.  Gastrointestinal: Negative for nausea, vomiting, abdominal pain, diarrhea and constipation.  Genitourinary: Negative for dysuria, urgency, frequency and hematuria.  Musculoskeletal: Positive for back pain (see history of present illness). Negative for arthralgias, myalgias, neck pain and neck stiffness.  Skin: Negative for rash.  Neurological: Negative for dizziness, weakness and light-headedness.    Allergies  Penicillins  Home Medications   Current Outpatient Rx  Name  Route  Sig  Dispense  Refill  . oxyCODONE-acetaminophen (PERCOCET) 5-325 MG per tablet      1 to 2 tablets every 6 hours as needed for pain.   20 tablet   0   . azithromycin (ZITHROMAX Z-PAK) 250 MG tablet      Use as directed   6 each   0   . diazepam (VALIUM) 5 MG tablet   Oral   Take 1 tablet (5 mg total) by mouth every 6 (six) hours as needed for muscle spasms.   10 tablet   0   . ibuprofen (ADVIL,MOTRIN) 200 MG tablet   Oral   Take 200-400 mg by mouth every 6 (six) hours as needed for moderate pain.         Marland Kitchen ibuprofen (ADVIL,MOTRIN) 800 MG tablet   Oral   Take 1 tablet (800 mg total) by mouth every 6 (six) hours as needed for mild pain or moderate pain.   21 tablet  0   . oxyCODONE-acetaminophen (PERCOCET) 10-325 MG per tablet   Oral   Take 1 tablet by mouth every 6 (six) hours as needed for pain.   20 tablet   0   . oxyCODONE-acetaminophen (PERCOCET/ROXICET) 5-325 MG per tablet   Oral   Take 1-2 tablets by mouth every 6 (six) hours as needed for severe pain.   21 tablet   0   . polyethylene glycol powder (GLYCOLAX/MIRALAX) powder   Oral   Take 17 g by mouth 2 (two) times daily. Until daily soft stools  OTC   255 g   0    BP 161/80  Pulse 80  Temp(Src) 98.2 F (36.8 C) (Oral)  Resp 16  SpO2 100% Physical Exam  Nursing note and vitals reviewed. Constitutional: He is oriented to person, place, and time. He appears well-developed and  well-nourished. No distress.  HENT:  Head: Normocephalic.  Pulmonary/Chest: Effort normal. No respiratory distress. He has decreased breath sounds (Globally, due to rib pain with breathing). He has no wheezes. He has no rhonchi. He has no rales.  Neurological: He is alert and oriented to person, place, and time. Coordination normal.  Skin: Skin is warm and dry. No rash noted. He is not diaphoretic.  Psychiatric: He has a normal mood and affect. Judgment normal.    ED Course  Procedures (including critical care time) Labs Review Labs Reviewed - No data to display Imaging Review Dg Chest 2 View  08/08/2013   ADDENDUM REPORT: 08/08/2013 17:28  ADDENDUM: Again demonstrated, posterior lateral fractures of the 8th 9th and 10th ribs a left. Fractures are minimally more displaced than prior with approximately 4 mm displacement compared to 1 mm on prior. There is a trace left effusion. No pneumothorax. No consolidation  Findings conveyed toZACHARY, BAKER on 08/08/2013  at17:27.   Electronically Signed   By: Genevive Bi M.D.   On: 08/08/2013 17:28   08/08/2013   CLINICAL DATA:  Cough for 3 days  EXAM: CHEST  2 VIEW  COMPARISON:  DG ABD ACUTE W/CHEST dated 07/27/2013  FINDINGS: Normal cardiac silhouette. No effusion, infiltrate, or pneumothorax fibrotic change centrally. Degenerative osteophytosis of the thoracic spine.  IMPRESSION: 1.  No acute cardiopulmonary process.  . 2. Chronic bronchitic markings.  Electronically Signed: By: Genevive Bi M.D. On: 08/08/2013 17:11    Probable atelectasis, getting chest x-ray to rule out early pneumonia. Given Norco and ibuprofen prior to sending for the x-ray  MDM   1. Rib fracture, left, with routine healing, subsequent encounter    Rib fractures more displaced and there is a new left pleural effusion. He will return in 2 days to recheck this with repeat chest x-ray to make sure the effusion is not getting worse. He has also been given an incentive  spirometry, pain medicine, and we will use antibiotic given the worsening chest x-ray and cough to prevent the development of pneumonia. He will go to the emergency department if he has any obvious worsening in chest pain or shortness of breath.   Meds ordered this encounter  Medications  . ibuprofen (ADVIL,MOTRIN) tablet 800 mg    Sig:   . azithromycin (ZITHROMAX Z-PAK) 250 MG tablet    Sig: Use as directed    Dispense:  6 each    Refill:  0    Order Specific Question:  Supervising Provider    Answer:  Lorenz Coaster, DAVID C [id number]  . oxyCODONE-acetaminophen (ROXICET) 5-325 MG per tablet    Sig:  Take 1 tablet by mouth every 4 (four) hours as needed for severe pain.    Dispense:  30 tablet    Refill:  0    Order Specific Question:  Supervising Provider    Answer:  Reuben Likes [[id number]]      Graylon Good, PA-C 08/08/13 1610  Rodolph Bong, MD 08/09/13 [phone]  Medical screening examination/treatment/procedure(s) were performed by a resident physician or non-physician practitioner and as the supervising physician I was immediately available for consultation/collaboration.  Clementeen Graham, MD    Rodolph Bong, MD 08/09/13 [phone]

## 2013-08-08 NOTE — ED Notes (Signed)
Report recent fracture of several ribs two weeks ago still having pain, needs refill on pain meds.  Sob, with persistent dry cough.  Wound on center of back very itchy noticed one week ago.

## 2013-08-10 ENCOUNTER — Emergency Department (INDEPENDENT_AMBULATORY_CARE_PROVIDER_SITE_OTHER): Payer: Self-pay

## 2013-08-10 ENCOUNTER — Emergency Department (INDEPENDENT_AMBULATORY_CARE_PROVIDER_SITE_OTHER)
Admission: EM | Admit: 2013-08-10 | Discharge: 2013-08-10 | Disposition: A | Discharge status: Home / Self Care | Payer: Self-pay | Source: Home / Self Care | Attending: Family Medicine | Admitting: Family Medicine

## 2013-08-10 ENCOUNTER — Encounter (HOSPITAL_COMMUNITY): Payer: Self-pay | Admitting: Emergency Medicine

## 2013-08-10 DIAGNOSIS — J9819 Other pulmonary collapse: Secondary | ICD-10-CM

## 2013-08-10 DIAGNOSIS — J9811 Atelectasis: Secondary | ICD-10-CM

## 2013-08-10 DIAGNOSIS — S2232XS Fracture of one rib, left side, sequela: Secondary | ICD-10-CM

## 2013-08-10 DIAGNOSIS — IMO0002 Reserved for concepts with insufficient information to code with codable children: Secondary | ICD-10-CM

## 2013-08-10 MED ORDER — DICLOFENAC POTASSIUM 50 MG PO TABS: 50.0000 mg | ORAL_TABLET | Freq: Three times a day (TID) | ORAL | Status: DC

## 2013-08-10 NOTE — Discharge Instructions (Signed)
Atelectasis, Adult Atelectasis is a collapse of the small air sacs in the lungs (alveoli). When this occurs, all or part of a lung collapses and becomes airless. It can be caused by various things and is a common problem after surgery. The severity of atelectasis will vary depending on the size of the area involved and the underlying cause of the condition. CAUSES  There are multiple causes for atelectasis:   Shallow breathing, particularly if there is an injury to your chest wall or abdomen that makes it painful to take a deep breath. This commonly occurs after surgery.  Obstruction of your airways (bronchi or bronchioles). This may be caused by a buildup of mucus (mucus plug), tumors, blood clots (pulmonary embolus), or inhaled foreign bodies. Mucus plugs occur when the lungs do not expand enough to get rid of mucus.  Outside pressure on the lung. This may be caused by tumors, fluid (pleural effusion), or a leakage of air between the lung and rib cage (pneumothorax).   Infections such as pneumonia.  Scarring in lung tissue left over from previous infection or injury.  Some diseases such as cystic fibrosis. SIGNS AND SYMPTOMS  Often, atelectasis will have no symptoms. When symptoms occur, they include:  Shortness of breath.   Bluish color to your nails, lips, or mouth (cyanosis). DIAGNOSIS  Your health care provider may suspect atelectasis based on symptoms and physical findings. A chest X-ray may be done to confirm the diagnosis. More specialized X-ray exams are sometimes required.  TREATMENT  Treatment will depend on the cause of the atelectasis. Treatment may include:  Purposeful coughing to loosen mucus plugs in the lungs.  Chest physiotherapy. This consists of clapping or percussion on the chest over the lungs to further loosen mucus plugs.  Postural drainage techniques. This involves positioning your body so your head is lower than your chest. HOME CARE  INSTRUCTIONS  Practice relaxed deep breathing whenever you are sitting down. A good technique is to take a few relaxed deep breaths each time a commercial comes on if you are watching television.  If you were given a deep breathing device (such as an incentive spirometer) or a mucus clearance device, use this regularly as directed by your health care provider.  Try to cough several times a day as directed by your health care provider.  Perform any chest physiotherapy or postural drainage techniques as directed by your health care provider. If necessary, have someone (such as a family member) assist you with these techniques.  When lying down, lie on the unaffected side to encourage mucus drainage.  Stay physically active as much as possible. SEEK IMMEDIATE MEDICAL CARE IF:   You develop increasing problems with your breathing.   You develop severe chest pain.   You develop severe coughing, or you cough up blood.   You have a fever or persistent symptoms for more than 2 3 days.   You have a fever and your symptoms suddenly get worse.  Document Released: 07/27/2005 Document Revised: 03/29/2013 Document Reviewed: 02/01/2013 [facility] Patient Information 2014 [place]ExitCare, [place]LLC.

## 2013-08-10 NOTE — ED Provider Notes (Signed)
CSN: [id number]     Arrival date & time 08/10/13  1413 History   First MD Initiated Contact with Patient 08/10/13 1513     Chief Complaint  Patient presents with  . Follow-up   (Consider location/radiation/quality/duration/timing/severity/associated sxs/prior Treatment) HPI Comments: A 48 year old male with back in for followup. I saw him 2 days ago, at that time history of fractures were found to be more displaced and he had a new left pleural effusion, minimal. He's here today at my request to recheck and repeat the x-ray to ensure that the effusion is not getting worse. In the interval, he has had some increasing pain allows that has started to develop some nausea this morning that resolved, no vomiting. He has been using the incentive spirometer as instructed. He also mentions that he feels gas pains/bubbling up under the left side of his ribs or in his stomach. His shortness of breath has not improved or worsened. Says overall that because of the pain, he does feel worse today.   Past Medical History  Diagnosis Date  . Back pain   . Hypertension   . Herniated disc   . Seizures   . Diabetes mellitus without complication     hypoglycemic   Past Surgical History  Procedure Laterality Date  . Fracture arm Left 1975    ? plate on ulna   Family History  Problem Relation Age of Onset  . Diabetes Mother    History  Substance Use Topics  . Smoking status: Current Some Day Smoker -- 0.25 packs/day    Types: Cigarettes  . Smokeless tobacco: Never Used  . Alcohol Use: Yes    Review of Systems  Constitutional: Negative for fever, chills and fatigue.  HENT: Negative for sore throat.   Eyes: Negative for visual disturbance.  Respiratory: Negative for cough and shortness of breath.   Cardiovascular: Negative for chest pain, palpitations and leg swelling.  Gastrointestinal: Negative for nausea, vomiting, abdominal pain, diarrhea and constipation.  Genitourinary: Negative for dysuria,  urgency, frequency and hematuria.  Musculoskeletal: Positive for back pain (From rib fractures, see history of present illness ). Negative for arthralgias, myalgias, neck pain and neck stiffness.  Skin: Negative for rash.  Neurological: Negative for dizziness, weakness and light-headedness.    Allergies  Penicillins  Home Medications   Current Outpatient Rx  Name  Route  Sig  Dispense  Refill  . azithromycin (ZITHROMAX Z-PAK) 250 MG tablet      Use as directed   6 each   0   . diazepam (VALIUM) 5 MG tablet   Oral   Take 1 tablet (5 mg total) by mouth every 6 (six) hours as needed for muscle spasms.   10 tablet   0   . diclofenac (CATAFLAM) 50 MG tablet   Oral   Take 1 tablet (50 mg total) by mouth 3 (three) times daily.   30 tablet   0   . ibuprofen (ADVIL,MOTRIN) 200 MG tablet   Oral   Take 200-400 mg by mouth every 6 (six) hours as needed for moderate pain.         Marland Kitchen ibuprofen (ADVIL,MOTRIN) 800 MG tablet   Oral   Take 1 tablet (800 mg total) by mouth every 6 (six) hours as needed for mild pain or moderate pain.   21 tablet   0   . oxyCODONE-acetaminophen (PERCOCET) 5-325 MG per tablet      1 to 2 tablets every 6 hours as needed for  pain.   20 tablet   0   . oxyCODONE-acetaminophen (PERCOCET/ROXICET) 5-325 MG per tablet   Oral   Take 1-2 tablets by mouth every 6 (six) hours as needed for severe pain.   21 tablet   0   . oxyCODONE-acetaminophen (ROXICET) 5-325 MG per tablet   Oral   Take 1 tablet by mouth every 4 (four) hours as needed for severe pain.   30 tablet   0   . polyethylene glycol powder (GLYCOLAX/MIRALAX) powder   Oral   Take 17 g by mouth 2 (two) times daily. Until daily soft stools  OTC   255 g   0    BP 127/58  Pulse 74  Temp(Src) 96.9 F (36.1 C) (Oral)  Resp 16  SpO2 99% Physical Exam  Nursing note and vitals reviewed. Constitutional: He is oriented to person, place, and time. He appears well-developed and well-nourished.  No distress.  HENT:  Head: Normocephalic.  Pulmonary/Chest: Effort normal. No respiratory distress. He has decreased breath sounds (Globally, due to rib pain with breathing). He has no wheezes. He has no rhonchi. He has no rales.  Neurological: He is alert and oriented to person, place, and time. Coordination normal.  Skin: Skin is warm and dry. No rash noted. He is not diaphoretic.  Psychiatric: He has a normal mood and affect. Judgment normal.    ED Course  Procedures (including critical care time) Labs Review Labs Reviewed - No data to display Imaging Review Dg Chest 2 View  08/10/2013   CLINICAL DATA:  Patient fell 2 weeks ago and fractured 3 posterior ribs on the left side, evaluate for hemothorax  EXAM: CHEST  2 VIEW  COMPARISON:  08/08/2013  FINDINGS: Heart size and mediastinal contours normal. Vascular pattern normal. No pneumothorax or pleural effusion. There is mild hazy opacity in the bilateral medial lung bases most consistent with atelectasis. Displaced fractures of the posterior left 8th, 9th, and 10th ribs are again identified.  IMPRESSION: Rib fractures again identified. No pneumothorax or pleural effusion. Mild bilateral lower lobe hazy opacities most consistent with mild atelectasis.   Electronically Signed   By: Esperanza Heiraymond  Rubner M.D.   On: 08/10/2013 16:26      MDM   1. Rib fracture, left, sequela   2. Atelectasis    Significant pain, now developing atelectasis. Add Diclofenac to his Percocet. Followup in 3 days for recheck.  Meds ordered this encounter  Medications  . diclofenac (CATAFLAM) 50 MG tablet    Sig: Take 1 tablet (50 mg total) by mouth 3 (three) times daily.    Dispense:  30 tablet    Refill:  0      Graylon GoodZachary H Yalanda Soderman, PA-C 08/10/13 1739

## 2013-08-10 NOTE — ED Notes (Signed)
Follow up on left side rib pain States he was seen two days ago States he feels worst

## 2013-08-11 NOTE — ED Provider Notes (Signed)
Medical screening examination/treatment/procedure(s) were performed by resident physician or non-physician practitioner and as supervising physician I was immediately available for consultation/collaboration.   Barkley BrunsKINDL,Domenik Trice DOUGLAS MD.   Linna HoffJames D Zayed Griffie, MD 08/11/13 [phone]

## 2014-05-28 ENCOUNTER — Encounter (HOSPITAL_COMMUNITY): Payer: Self-pay | Admitting: Emergency Medicine

## 2014-05-28 ENCOUNTER — Emergency Department (INDEPENDENT_AMBULATORY_CARE_PROVIDER_SITE_OTHER)
Admission: EM | Admit: 2014-05-28 | Discharge: 2014-05-28 | Disposition: A | Discharge status: Home / Self Care | Payer: Self-pay | Source: Home / Self Care | Attending: Family Medicine | Admitting: Family Medicine

## 2014-05-28 DIAGNOSIS — M5441 Lumbago with sciatica, right side: Secondary | ICD-10-CM

## 2014-05-28 MED ORDER — CYCLOBENZAPRINE HCL 10 MG PO TABS: 10.0000 mg | ORAL_TABLET | Freq: Three times a day (TID) | ORAL | Status: DC | PRN

## 2014-05-28 MED ORDER — PREDNISONE 10 MG PO KIT: PACK | ORAL | Status: DC

## 2014-05-28 NOTE — ED Provider Notes (Signed)
Tom Conley is a 48 y.o. male who presents to Urgent Care today for chronic back pain exacerbation. Patient has a chronic back pain history over the last several years. Over the past several months he's had pain radiating from his right buttock to his right knee. This is worsened recently. He denies any new weakness or numbness bowel bladder dysfunction or difficulty walking. He's tried ibuprofen which has not helped much. No recent injury.   Past Medical History  Diagnosis Date  . Back pain   . Hypertension   . Herniated disc   . Seizures   . Diabetes mellitus without complication     hypoglycemic   History  Substance Use Topics  . Smoking status: Current Some Day Smoker -- 0.25 packs/day    Types: Cigarettes  . Smokeless tobacco: Never Used  . Alcohol Use: Yes   ROS as above Medications: No current facility-administered medications for this encounter.   Current Outpatient Prescriptions  Medication Sig Dispense Refill  . azithromycin (ZITHROMAX Z-PAK) 250 MG tablet Use as directed  6 each  0  . cyclobenzaprine (FLEXERIL) 10 MG tablet Take 1 tablet (10 mg total) by mouth 3 (three) times daily as needed for muscle spasms.  30 tablet  0  . diazepam (VALIUM) 5 MG tablet Take 1 tablet (5 mg total) by mouth every 6 (six) hours as needed for muscle spasms.  10 tablet  0  . diclofenac (CATAFLAM) 50 MG tablet Take 1 tablet (50 mg total) by mouth 3 (three) times daily.  30 tablet  0  . ibuprofen (ADVIL,MOTRIN) 200 MG tablet Take 200-400 mg by mouth every 6 (six) hours as needed for moderate pain.      Marland Kitchen ibuprofen (ADVIL,MOTRIN) 800 MG tablet Take 1 tablet (800 mg total) by mouth every 6 (six) hours as needed for mild pain or moderate pain.  21 tablet  0  . oxyCODONE-acetaminophen (PERCOCET) 5-325 MG per tablet 1 to 2 tablets every 6 hours as needed for pain.  20 tablet  0  . oxyCODONE-acetaminophen (PERCOCET/ROXICET) 5-325 MG per tablet Take 1-2 tablets by mouth every 6 (six) hours as  needed for severe pain.  21 tablet  0  . oxyCODONE-acetaminophen (ROXICET) 5-325 MG per tablet Take 1 tablet by mouth every 4 (four) hours as needed for severe pain.  30 tablet  0  . polyethylene glycol powder (GLYCOLAX/MIRALAX) powder Take 17 g by mouth 2 (two) times daily. Until daily soft stools  OTC  255 g  0  . PredniSONE 10 MG KIT 12 day dose pack taper PO  1 kit  0    Exam:  BP 164/108  Pulse 85  Temp(Src) 96.9 F (36.1 C) (Oral)  Resp 16  Ht $R'5\' 9"'Hv$  (1.753 m)  Wt 180 lb (81.647 kg)  BMI 26.57 kg/m2  SpO2 97% Gen: Well NAD  Back: Nontender spinal midline. Decreased back range of motion. Normal hip range of motion bilaterally. Reflexes are equal and normal bilaterally. Antalgic gait with a cane. Strength is intact bilateral lower extremities  No results found for this or any previous visit (from the past 24 hour(s)). No results found.  Assessment and Plan: 48 y.o. male with acute on chronic low back pain with radiculopathy. Concern for sciatica. Treatment with prednisone Dosepak and Flexeril. Follow up with sports medicine.  Discussed warning signs or symptoms. Please see discharge instructions. Patient expresses understanding.     Gregor Hams, MD 05/28/14 [phone]

## 2014-05-28 NOTE — ED Notes (Signed)
Pt is here today because he has had back pain for about 4 years and he has also had hip pain for about 3 months, pt also complains of right hand numbness

## 2014-05-28 NOTE — ED Provider Notes (Signed)
CSN: [id number]     Arrival date & time 05/28/14  1613 History   First MD Initiated Contact with Patient 05/28/14 1622     Chief Complaint  Patient presents with  . Back Pain   HPI  LOW BACK PAIN PAIN, RIGHT: - Reports chronic low back pain for past 4 years, constant back pain on most days, pains described as aching, sharp, numbness, worse with prolonged position for too long (sitting, laying, walking). Worsening over past 3 to 4 months with Right lower back/buttock pain associated with sharp radiating down to back of Right knee, constantly for past 2-3 days. Uses cane for ambulation. - Currently taking Ibuprofen $RemoveBeforeDE'800mg'poUpoAHPMcWqcOE$  about 1-2x daily for past 1 week without much relief. Admits Percocet was most helpful, last took in January 2015 (for fractured ribs) - Admits to fall 3 weeks ago, fall due to pain - Denies any back injury, or surgery - Denies fevers/chills, urinary incontinence, numbness, weakness   Past Medical History  Diagnosis Date  . Back pain   . Hypertension   . Herniated disc   . Seizures   . Diabetes mellitus without complication     hypoglycemic   Past Surgical History  Procedure Laterality Date  . Fracture arm Left 1975    ? plate on ulna   Family History  Problem Relation Age of Onset  . Diabetes Mother    History  Substance Use Topics  . Smoking status: Current Some Day Smoker -- 0.25 packs/day    Types: Cigarettes  . Smokeless tobacco: Never Used  . Alcohol Use: Yes    Review of Systems  See above HPI  Allergies  Penicillins  Home Medications   Prior to Admission medications   Medication Sig Start Date End Date Taking? Authorizing Provider  azithromycin (ZITHROMAX Z-PAK) 250 MG tablet Use as directed 08/08/13   Liam Graham, PA-C  cyclobenzaprine (FLEXERIL) 10 MG tablet Take 1 tablet (10 mg total) by mouth 3 (three) times daily as needed for muscle spasms. 05/28/14   Nobie Putnam, DO  diazepam (VALIUM) 5 MG tablet Take 1 tablet (5 mg  total) by mouth every 6 (six) hours as needed for muscle spasms. 07/27/13   Jennifer L Piepenbrink, PA-C  diclofenac (CATAFLAM) 50 MG tablet Take 1 tablet (50 mg total) by mouth 3 (three) times daily. 08/10/13   Freeman Caldron Baker, PA-C  ibuprofen (ADVIL,MOTRIN) 200 MG tablet Take 200-400 mg by mouth every 6 (six) hours as needed for moderate pain.    Historical Provider, MD  ibuprofen (ADVIL,MOTRIN) 800 MG tablet Take 1 tablet (800 mg total) by mouth every 6 (six) hours as needed for mild pain or moderate pain. 07/27/13   Jennifer L Piepenbrink, PA-C  oxyCODONE-acetaminophen (PERCOCET) 5-325 MG per tablet 1 to 2 tablets every 6 hours as needed for pain. 08/04/13   Harden Mo, MD  oxyCODONE-acetaminophen (PERCOCET/ROXICET) 5-325 MG per tablet Take 1-2 tablets by mouth every 6 (six) hours as needed for severe pain. 07/27/13   Antonietta Breach, PA-C  oxyCODONE-acetaminophen (ROXICET) 5-325 MG per tablet Take 1 tablet by mouth every 4 (four) hours as needed for severe pain. 08/08/13   Freeman Caldron Baker, PA-C  polyethylene glycol powder (GLYCOLAX/MIRALAX) powder Take 17 g by mouth 2 (two) times daily. Until daily soft stools  OTC 07/27/13   Stephani Police Piepenbrink, PA-C  PredniSONE 10 MG KIT 12 day dose pack taper PO 05/28/14   Alexander Parks Ranger, DO   BP 164/108  Pulse 85  Temp(Src)  96.9 F (36.1 C) (Oral)  Resp 16  Ht 5\' 9"  (1.753 m)  Wt 180 lb (81.647 kg)  BMI 26.57 kg/m2  SpO2 97% Physical Exam  Gen - uncomfortable due to pain, cooperative, NAD HEENT - NCAT, MMM MSK - Back: tenderness over Lumbar spinous process and SI region, paraspinal muscle hypertonicity and tenderness Right > Left. Limited back extension due to pain, normal forward flexion ROM. Able to stand. Seated SLR with right-sided reproduction of radiating pain to knee. Ext - non-tender, no edema, peripheral pulses intact +2 b/l Skin - warm, dry Neuro - awake, alert, oriented, grossly non-focal, intact muscle strength 5/5 b/l grip  and ankles, intact distal sensation to light touch bilateral lower ext, gait slow due to pain but normal, walk on toes and heels.  ED Course  Procedures (including critical care time) Labs Review Labs Reviewed - No data to display  Imaging Review No results found.   MDM   1. 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 Clinically consistent with low back muscle spasms and radicular pain. No red flag low back symptoms. Reassuring exam without weakness, numbness. Ambulates with cane. Without regular follow-up.  Discharge to home, prescribed Prednisone 10mg  dose pack kit (12 day), Flexeril PRN, continue Ibuprofen, Tylenol PRN, recommend conservative stretching, moist heat. Referred to [facility], resources for locating PCP. Follow-up if worsening, return precautions given.   Nobie Putnam, DO 05/28/14 1703

## 2014-05-28 NOTE — Discharge Instructions (Signed)
You were prescribed Flexeril and Prednisone. Take prednisone as prescribed for the next 12 days to reduce pain and swelling, to ease your back pain Take Flexeril (muscle relaxant) as needed over next 1 month - 1 tablet 3 times daily as needed. Continue Ibuprofen, also take Tylenol as needed.  If symptoms significantly worsen or you have fall or other injury, please return to Urgent Care or Emergency Department for re-evaluation.   Please call or see Ms Antionette CharMaggy Mena for assistance with your bill.  You may qualify for reduced or free services.  Her phone number is [phone]. Her email is [email or web address]    Back Pain, Adult Back pain is very common. The pain often gets better over time. The cause of back pain is usually not dangerous. Most people can learn to manage their back pain on their own.  HOME CARE   Stay active. Start with short walks on flat ground if you can. Try to walk farther each day.  Do not sit, drive, or stand in one place for more than 30 minutes. Do not stay in bed.  Do not avoid exercise or work. Activity can help your back heal faster.  Be careful when you bend or lift an object. Bend at your knees, keep the object close to you, and do not twist.  Sleep on a firm mattress. Lie on your side, and bend your knees. If you lie on your back, put a pillow under your knees.  Only take medicines as told by your doctor.  Put ice on the injured area.  Put ice in a plastic bag.  Place a towel between your skin and the bag.  Leave the ice on for 15-20 minutes, 03-04 times a day for the first 2 to 3 days. After that, you can switch between ice and heat packs.  Ask your doctor about back exercises or massage.  Avoid feeling anxious or stressed. Find good ways to deal with stress, such as exercise. GET HELP RIGHT AWAY IF:   Your pain does not go away with rest or medicine.  Your pain does not go away in 1 week.  You have new problems.  You do  not feel well.  The pain spreads into your legs.  You cannot control when you poop (bowel movement) or pee (urinate).  Your arms or legs feel weak or lose feeling (numbness).  You feel sick to your stomach (nauseous) or throw up (vomit).  You have belly (abdominal) pain.  You feel like you may pass out (faint). MAKE SURE YOU:   Understand these instructions.  Will watch your condition.  Will get help right away if you are not doing well or get worse. Document Released: 01/13/2008 Document Revised: 10/19/2011 Document Reviewed: 11/28/2013 [facility] Patient Information 2015 [employer]Care, [place]LLC. This information is not intended to replace advice given to you by your health care provider. Make sure you discuss any questions you have with your health care provider.  PRIMARY CARE Merchant navy officerDOCTORS [facility] [address]  [place], [place] Ph [phone]  Fax [phone]  Nature conservation officerLeBauer [facility]Care at [facility] [address]  [place], [place] Ph [phone]  Fax [phone]  Nature conservation officerLeBauer HealthCare at [place] / [place] [phone]  [place], [place] Ph [phone]  Fax [phone]  [facility] [address]  [place], [place] Ph [phone]  Fax [phone]  [facility] [address]  [place], [place] Ph [phone]  Fax [phone]  [facility] [address] [place]  [place], [place] Ph [phone]  Fax [phone]   [facility] [place] [address] [place] [address] Phone: [phone]   [facility] @ [facility] [address] [place] [address] Phone: [phone]   [facility] @ [place] [address] [place] [address] Phone: [phone]   [facility] @  [place] [address] [place] [address] Phone: [phone]   [facility] @ [place] [address] [place] [address] Phone: [phone] Fax: [phone]   [facility] @ [place] [address] [place] [address] Phone: [phone]   Dr. Maryelizabeth RowanElizabeth Dewey [address] [place] [address] [phone]

## 2014-05-30 NOTE — ED Provider Notes (Signed)
Medical screening examination/treatment/procedure(s) were performed by a resident physician or non-physician practitioner and as the supervising physician I was immediately available for consultation/collaboration.  Amreen Raczkowski, MD    Tamel Abel S Sitlaly Gudiel, MD 05/30/14 0813 

## 2015-03-06 IMAGING — CR DG THORACIC SPINE 2V
3 series · 3 of 3 positions shown · non-contrast
Comparison: Chest x-ray dated 08/17/2012

CLINICAL DATA: Posterior chest pain secondary to a recent seizure
and secondary blunt trauma.

EXAM:
THORACIC SPINE - 2 VIEW

[w t-spine a.p.]
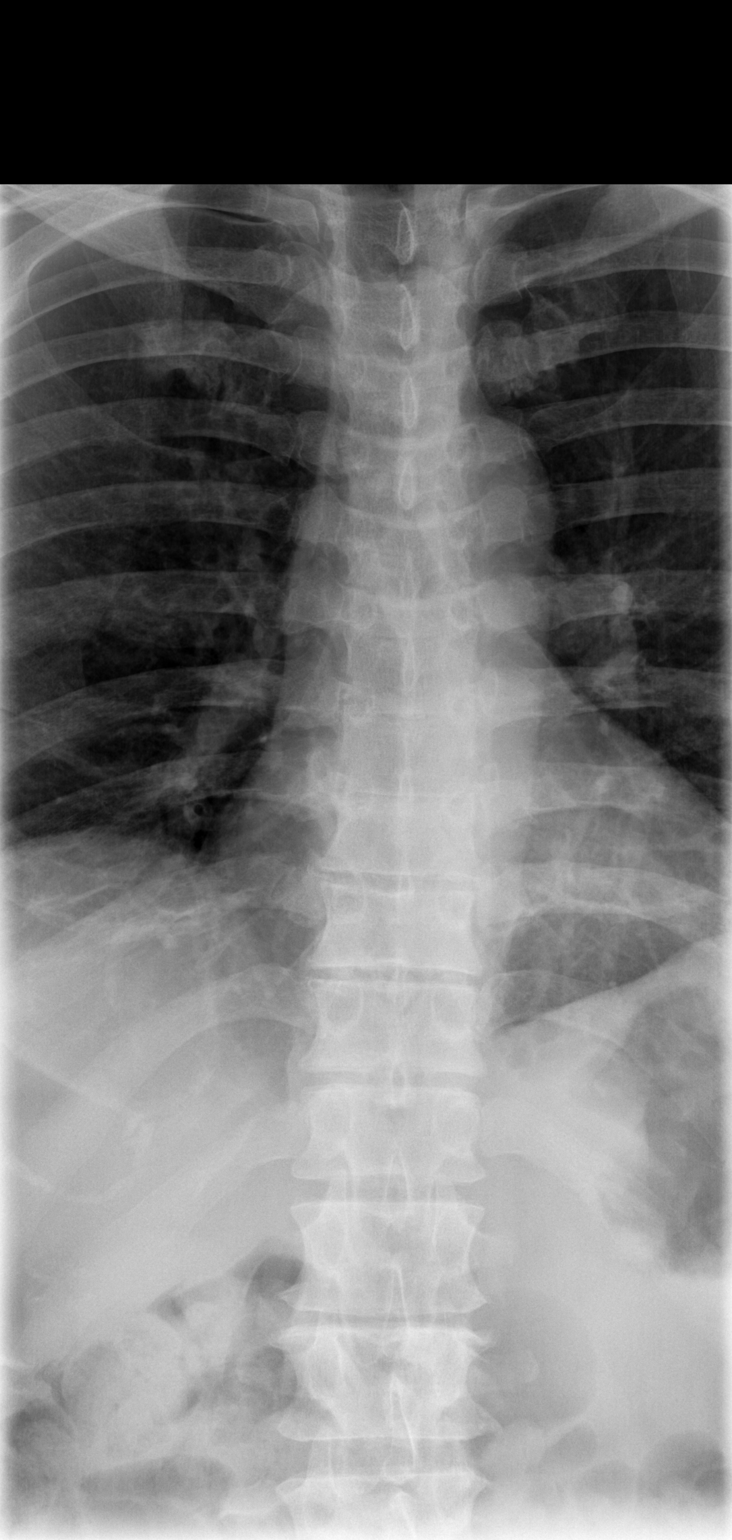

[w t-spine lat]
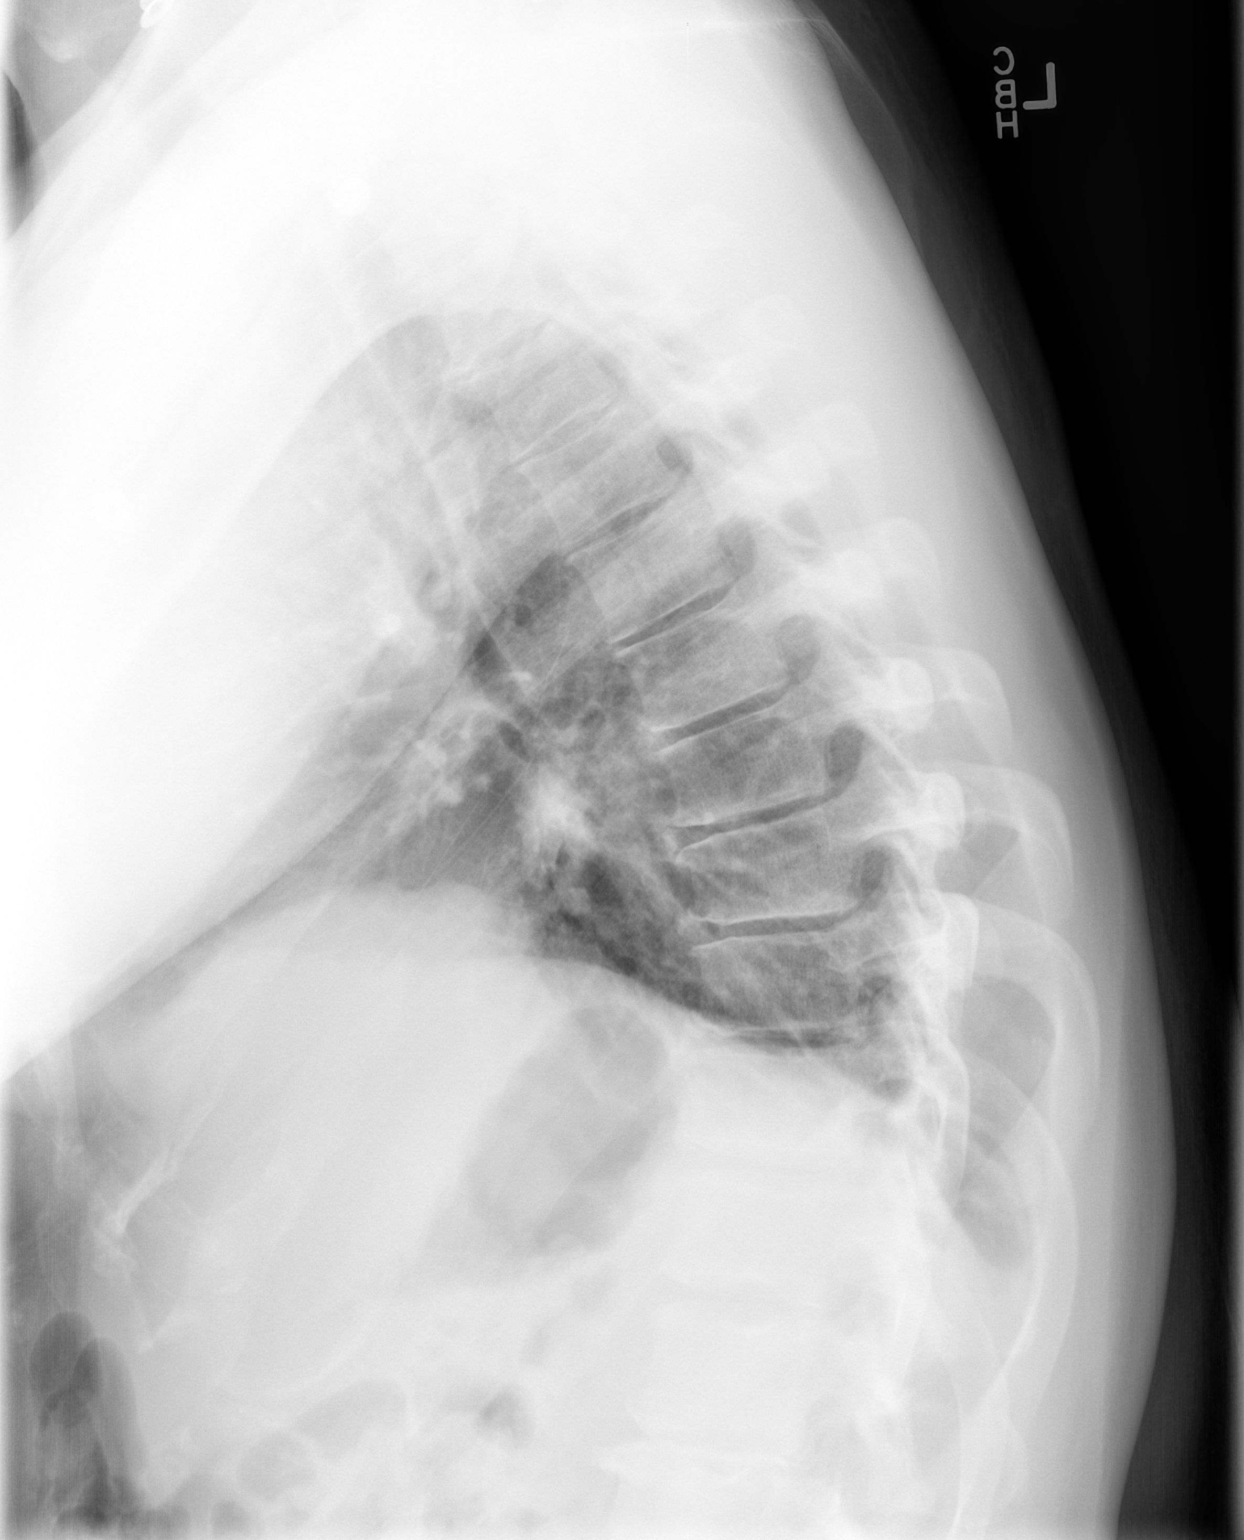

[w swimmers view]
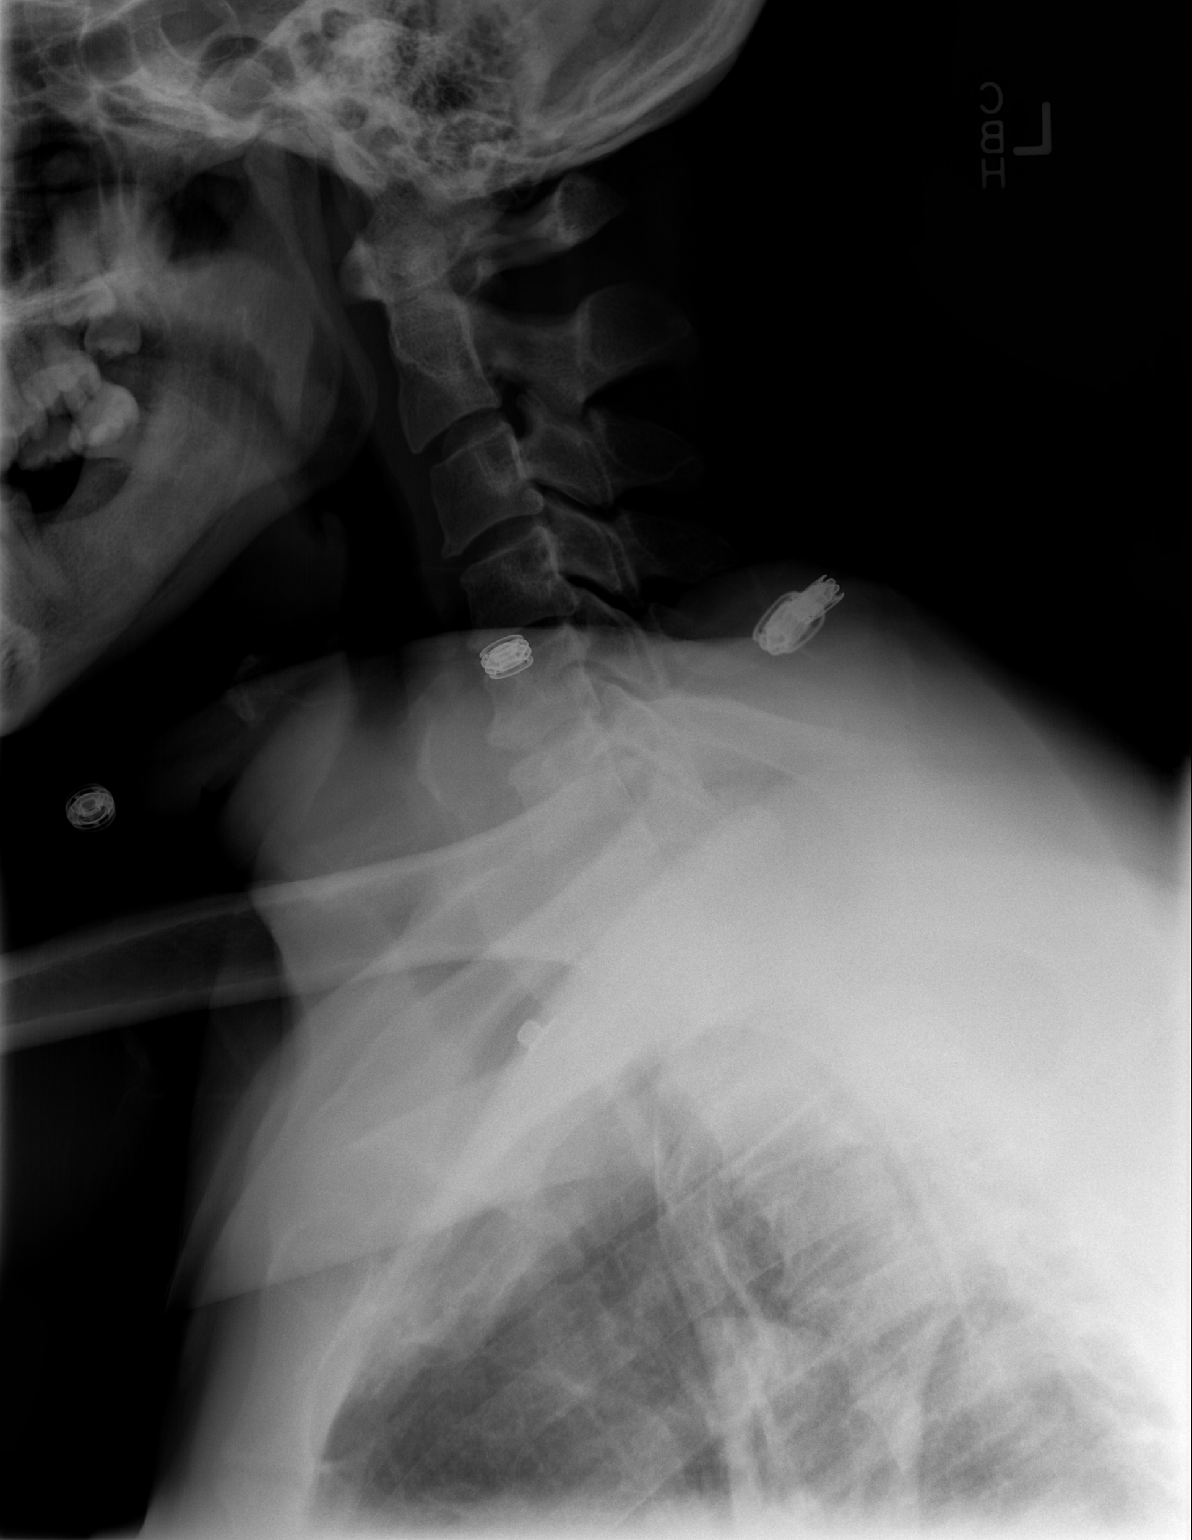

[3 of 3 positions shown; findings below may reference images not displayed]

FINDINGS: There is no evidence of thoracic spine fracture. Alignment is
normal. No other significant bone abnormalities are identified.
IMPRESSION: No significant abnormality.

## 2016-01-24 ENCOUNTER — Emergency Department (HOSPITAL_COMMUNITY)
Admission: EM | Admit: 2016-01-24 | Discharge: 2016-01-24 | Disposition: A | Discharge status: Home / Self Care | Payer: Self-pay | Attending: Emergency Medicine | Admitting: Emergency Medicine

## 2016-01-24 ENCOUNTER — Encounter (HOSPITAL_COMMUNITY): Payer: Self-pay | Admitting: Emergency Medicine

## 2016-01-24 DIAGNOSIS — G8929 Other chronic pain: Secondary | ICD-10-CM | POA: Insufficient documentation

## 2016-01-24 DIAGNOSIS — I10 Essential (primary) hypertension: Secondary | ICD-10-CM | POA: Insufficient documentation

## 2016-01-24 DIAGNOSIS — Z8669 Personal history of other diseases of the nervous system and sense organs: Secondary | ICD-10-CM | POA: Insufficient documentation

## 2016-01-24 DIAGNOSIS — F17210 Nicotine dependence, cigarettes, uncomplicated: Secondary | ICD-10-CM | POA: Insufficient documentation

## 2016-01-24 DIAGNOSIS — E119 Type 2 diabetes mellitus without complications: Secondary | ICD-10-CM | POA: Insufficient documentation

## 2016-01-24 DIAGNOSIS — M545 Low back pain: Secondary | ICD-10-CM | POA: Insufficient documentation

## 2016-01-24 DIAGNOSIS — Z79899 Other long term (current) drug therapy: Secondary | ICD-10-CM | POA: Insufficient documentation

## 2016-01-24 MED ORDER — KETOROLAC TROMETHAMINE 30 MG/ML IJ SOLN
30.0000 mg | Freq: Once | INTRAMUSCULAR | Status: AC
  Administered 2016-01-24: 30 mg via INTRAMUSCULAR
  Filled 2016-01-24: qty 1

## 2016-01-24 MED ORDER — KETOROLAC TROMETHAMINE 10 MG PO TABS: 10.0000 mg | ORAL_TABLET | Freq: Four times a day (QID) | ORAL | Status: DC | PRN

## 2016-01-24 MED ORDER — DIAZEPAM 5 MG PO TABS
5.0000 mg | ORAL_TABLET | Freq: Once | ORAL | Status: AC
  Administered 2016-01-24: 5 mg via ORAL
  Filled 2016-01-24: qty 1

## 2016-01-24 NOTE — ED Provider Notes (Signed)
CSN: [id number]     Arrival date & time 01/24/16  1714 History  By signing my name below, I, Stephania Fragmin, attest that this documentation has been prepared under the direction and in the presence of Comer Locket, PA-C. Electronically Signed: Stephania Fragmin, ED Scribe. 01/24/2016. 7:17 PM.    Chief Complaint  Patient presents with  . Back Pain   The history is provided by the patient. No language interpreter was used.   HPI Comments: Tom Conley is a 50 y.o. male with a history of back pain, hypertension, herniated disc, seizures, and DM, who presents to the Emergency Department complaining of an acute on chronic exacerbation of constant, 10/10 bilateral lower back pain, left greater than right, that began 2 days ago. He denies any known trauma or injury, or trigger of his symptoms. Changing positions exacerbates his pain. Patient states he has taken Vicodin with moderate relief. He denies a history of IVDU. He reports a history of sciatica in his L4-L5 that has been ongoing for several years. He denies fever, bowel or bladder incontinence, or abdominal pain, Numbness or weakness. Patient reports known allergies to penicillin. Patient reports he currently does not have a PCP. He states someone else is driving him home today.  Past Medical History  Diagnosis Date  . Back pain   . Hypertension   . Herniated disc   . Seizures ([place])   . Diabetes mellitus without complication ([place])     hypoglycemic   Past Surgical History  Procedure Laterality Date  . Fracture arm Left 1975    ? plate on ulna   Family History  Problem Relation Age of Onset  . Diabetes Mother    Social History  Substance Use Topics  . Smoking status: Current Some Day Smoker -- 0.25 packs/day    Types: Cigarettes  . Smokeless tobacco: Never Used  . Alcohol Use: Yes    Review of Systems  A complete 10 system review of systems was obtained and all systems are negative except as noted in the HPI and PMH.    Allergies   Penicillins  Home Medications   Prior to Admission medications   Medication Sig Start Date End Date Taking? Authorizing Provider  azithromycin (ZITHROMAX Z-PAK) 250 MG tablet Use as directed 08/08/13   Liam Graham, PA-C  cyclobenzaprine (FLEXERIL) 10 MG tablet Take 1 tablet (10 mg total) by mouth 3 (three) times daily as needed for muscle spasms. 05/28/14   Olin Hauser, DO  diazepam (VALIUM) 5 MG tablet Take 1 tablet (5 mg total) by mouth every 6 (six) hours as needed for muscle spasms. 07/27/13   Jennifer Piepenbrink, PA-C  diclofenac (CATAFLAM) 50 MG tablet Take 1 tablet (50 mg total) by mouth 3 (three) times daily. 08/10/13   Freeman Caldron Baker, PA-C  ibuprofen (ADVIL,MOTRIN) 200 MG tablet Take 200-400 mg by mouth every 6 (six) hours as needed for moderate pain.    Historical Provider, MD  ibuprofen (ADVIL,MOTRIN) 800 MG tablet Take 1 tablet (800 mg total) by mouth every 6 (six) hours as needed for mild pain or moderate pain. 07/27/13   Jennifer Piepenbrink, PA-C  ketorolac (TORADOL) 10 MG tablet Take 1 tablet (10 mg total) by mouth every 6 (six) hours as needed. 01/24/16   Comer Locket, PA-C  oxyCODONE-acetaminophen (PERCOCET) 5-325 MG per tablet 1 to 2 tablets every 6 hours as needed for pain. 08/04/13   Harden Mo, MD  oxyCODONE-acetaminophen (PERCOCET/ROXICET) 5-325 MG per tablet Take 1-2  tablets by mouth every 6 (six) hours as needed for severe pain. 07/27/13   Antonietta Breach, PA-C  oxyCODONE-acetaminophen (ROXICET) 5-325 MG per tablet Take 1 tablet by mouth every 4 (four) hours as needed for severe pain. 08/08/13   Freeman Caldron Baker, PA-C  polyethylene glycol powder (GLYCOLAX/MIRALAX) powder Take 17 g by mouth 2 (two) times daily. Until daily soft stools  OTC 07/27/13   Baron Sane, PA-C  PredniSONE 10 MG KIT 12 day dose pack taper PO 05/28/14   Alexander J Karamalegos, DO   BP 153/101 mmHg  Pulse 89  Temp(Src) 98.5 F (36.9 C) (Oral)  Resp 16  SpO2  98% Physical Exam  Constitutional: He is oriented to person, place, and time. He appears well-developed and well-nourished. No distress.  HENT:  Head: Normocephalic and atraumatic.  Eyes: Conjunctivae and EOM are normal. Right eye exhibits no discharge. Left eye exhibits no discharge. No scleral icterus.  Neck: Normal range of motion. Neck supple. No tracheal deviation present.  Cardiovascular: Normal rate.   Pulmonary/Chest: Effort normal. No respiratory distress.  Abdominal: Soft. He exhibits no distension. There is no tenderness.  Musculoskeletal: Normal range of motion.  Diffuse tenderness in left lower lumbar paraspinal musculature. No focal midline bony or spinous process tenderness. No crepitus, tenting, rash or other skin abnormality. Full active range of motion of CTL spine. Full active range of motion of all extremities.  Neurological: He is alert and oriented to person, place, and time.  Motor strength and sensation appear baseline for patient. Able to stand on tiptoe, dorsiflex great toe. Gait baseline.  Skin: Skin is warm and dry. He is not diaphoretic.  Psychiatric: He has a normal mood and affect. His behavior is normal.  Nursing note and vitals reviewed.   ED Course  Procedures (including critical care time)  DIAGNOSTIC STUDIES: Oxygen Saturation is 98% on RA, normal by my interpretation.    COORDINATION OF CARE: 6:48 PM - Discussed treatment plan with pt at bedside which includes analgesic medication administered here. Will give referral to PCP. Pt verbalized understanding and agreed to plan.   MDM   Final diagnoses:  Chronic low back pain   Patient with Acute on chronic back pain.  No neurological deficits and normal neuro exam.  Patient can walk but states is painful.  No loss of bowel or bladder control.  No concern for cauda equina.  No fever, night sweats, weight loss, h/o cancer, IVDU.  RICE protocol and pain medicine indicated and discussed with patient.   Toradol injection and Valium administered here while in the ED.  I personally performed the services described in this documentation, which was scribed in my presence. The recorded information has been reviewed and is accurate.     Comer Locket, PA-C 01/24/16 1930  Blanchie Dessert, MD 01/25/16 0800

## 2016-01-24 NOTE — Discharge Instructions (Signed)
Take your medicine as prescribed and [facility] to find a primary care provider. Return to ED for any new or worsening symptoms.  Chronic Back Pain  When back pain lasts longer than 3 months, it is called chronic back pain.People with chronic back pain often go through certain periods that are more intense (flare-ups).  CAUSES Chronic back pain can be caused by wear and tear (degeneration) on different structures in your back. These structures include:  The bones of your spine (vertebrae) and the joints surrounding your spinal cord and nerve roots (facets).  The strong, fibrous tissues that connect your vertebrae (ligaments). Degeneration of these structures may result in pressure on your nerves. This can lead to constant pain. HOME CARE INSTRUCTIONS  Avoid bending, heavy lifting, prolonged sitting, and activities which make the problem worse.  Take brief periods of rest throughout the day to reduce your pain. Lying down or standing usually is better than sitting while you are resting.  Take over-the-counter or prescription medicines only as directed by your caregiver. SEEK IMMEDIATE MEDICAL CARE IF:   You have weakness or numbness in one of your legs or feet.  You have trouble controlling your bladder or bowels.  You have nausea, vomiting, abdominal pain, shortness of breath, or fainting.   This information is not intended to replace advice given to you by your health care provider. Make sure you discuss any questions you have with your health care provider.   Document Released: 09/03/2004 Document Revised: 10/19/2011 Document Reviewed: 01/14/2015 Elsevier Interactive Patient Education [employer]2016 Elsevier Inc.

## 2016-01-24 NOTE — ED Notes (Signed)
Pt reports acute chronic lower back pain worsening since Wednesday; denies injury.

## 2016-06-24 ENCOUNTER — Observation Stay (HOSPITAL_COMMUNITY)
Admission: EM | Admit: 2016-06-24 | Discharge: 2016-06-25 | Discharge status: Against Medical Advice | Payer: Self-pay | Attending: Family Medicine | Admitting: Family Medicine

## 2016-06-24 ENCOUNTER — Encounter (HOSPITAL_COMMUNITY): Payer: Self-pay | Admitting: Emergency Medicine

## 2016-06-24 ENCOUNTER — Encounter (HOSPITAL_COMMUNITY): Payer: Self-pay | Admitting: *Deleted

## 2016-06-24 ENCOUNTER — Ambulatory Visit (HOSPITAL_COMMUNITY)
Admission: EM | Admit: 2016-06-24 | Discharge: 2016-06-24 | Disposition: A | Discharge status: Nursing Home (Includes ICF) | Payer: Self-pay | Attending: Emergency Medicine | Admitting: Emergency Medicine

## 2016-06-24 DIAGNOSIS — M549 Dorsalgia, unspecified: Secondary | ICD-10-CM

## 2016-06-24 DIAGNOSIS — Z880 Allergy status to penicillin: Secondary | ICD-10-CM | POA: Insufficient documentation

## 2016-06-24 DIAGNOSIS — F1910 Other psychoactive substance abuse, uncomplicated: Secondary | ICD-10-CM | POA: Diagnosis present

## 2016-06-24 DIAGNOSIS — I10 Essential (primary) hypertension: Secondary | ICD-10-CM | POA: Insufficient documentation

## 2016-06-24 DIAGNOSIS — F17210 Nicotine dependence, cigarettes, uncomplicated: Secondary | ICD-10-CM | POA: Insufficient documentation

## 2016-06-24 DIAGNOSIS — R51 Headache: Principal | ICD-10-CM | POA: Insufficient documentation

## 2016-06-24 DIAGNOSIS — H532 Diplopia: Secondary | ICD-10-CM | POA: Insufficient documentation

## 2016-06-24 DIAGNOSIS — R519 Headache, unspecified: Secondary | ICD-10-CM | POA: Diagnosis present

## 2016-06-24 DIAGNOSIS — G8929 Other chronic pain: Secondary | ICD-10-CM | POA: Diagnosis present

## 2016-06-24 DIAGNOSIS — H539 Unspecified visual disturbance: Secondary | ICD-10-CM

## 2016-06-24 DIAGNOSIS — E119 Type 2 diabetes mellitus without complications: Secondary | ICD-10-CM | POA: Insufficient documentation

## 2016-06-24 DIAGNOSIS — Z87898 Personal history of other specified conditions: Secondary | ICD-10-CM

## 2016-06-24 DIAGNOSIS — H538 Other visual disturbances: Secondary | ICD-10-CM

## 2016-06-24 DIAGNOSIS — R739 Hyperglycemia, unspecified: Secondary | ICD-10-CM | POA: Diagnosis present

## 2016-06-24 DIAGNOSIS — M5442 Lumbago with sciatica, left side: Secondary | ICD-10-CM

## 2016-06-24 LAB — URINALYSIS, ROUTINE W REFLEX MICROSCOPIC
Bilirubin Urine: NEGATIVE
GLUCOSE, UA: NEGATIVE mg/dL
Hgb urine dipstick: NEGATIVE
KETONES UR: NEGATIVE mg/dL
LEUKOCYTES UA: NEGATIVE
NITRITE: NEGATIVE
PROTEIN: NEGATIVE mg/dL
Specific Gravity, Urine: 1.025 (ref 1.005–1.030)
pH: 5 (ref 5.0–8.0)

## 2016-06-24 LAB — COMPREHENSIVE METABOLIC PANEL
ALK PHOS: 94 U/L (ref 38–126)
ALT: 15 U/L — AB (ref 17–63)
ANION GAP: 6 (ref 5–15)
AST: 17 U/L (ref 15–41)
Albumin: 3.4 g/dL — ABNORMAL LOW (ref 3.5–5.0)
BILIRUBIN TOTAL: 0.4 mg/dL (ref 0.3–1.2)
BUN: 11 mg/dL (ref 6–20)
CALCIUM: 9.4 mg/dL (ref 8.9–10.3)
CO2: 28 mmol/L (ref 22–32)
Chloride: 107 mmol/L (ref 101–111)
Creatinine, Ser: 1.02 mg/dL (ref 0.61–1.24)
GFR calc Af Amer: >60 mL/min (ref >60)
GFR calc non Af Amer: >60 mL/min (ref >60)
Glucose, Bld: 133 mg/dL — ABNORMAL HIGH (ref 65–99)
Potassium: 4.2 mmol/L (ref 3.5–5.1)
SODIUM: 141 mmol/L (ref 135–145)
TOTAL PROTEIN: 6.7 g/dL (ref 6.5–8.1)

## 2016-06-24 LAB — CBC
HCT: 39.8 % (ref 39.0–52.0)
HEMOGLOBIN: 13.2 g/dL (ref 13.0–17.0)
MCH: 33.2 pg (ref 26.0–34.0)
MCHC: 33.2 g/dL (ref 30.0–36.0)
MCV: 100.3 fL — ABNORMAL HIGH (ref 78.0–100.0)
PLATELETS: 276 10*3/uL (ref 150–400)
RBC: 3.97 MIL/uL — AB (ref 4.22–5.81)
RDW: 12.1 % (ref 11.5–15.5)
WBC: 6.9 10*3/uL (ref 4.0–10.5)

## 2016-06-24 LAB — I-STAT CHEM 8, ED
BUN: 14 mg/dL (ref 6–20)
CHLORIDE: 105 mmol/L (ref 101–111)
CREATININE: 1 mg/dL (ref 0.61–1.24)
Calcium, Ion: 1.21 mmol/L (ref 1.15–1.40)
Glucose, Bld: 129 mg/dL — ABNORMAL HIGH (ref 65–99)
HEMATOCRIT: 39 % (ref 39.0–52.0)
Hemoglobin: 13.3 g/dL (ref 13.0–17.0)
Potassium: 4.1 mmol/L (ref 3.5–5.1)
Sodium: 143 mmol/L (ref 135–145)
TCO2: 26 mmol/L (ref 0–100)

## 2016-06-24 LAB — ETHANOL: Alcohol, Ethyl (B): <5 mg/dL (ref <5)

## 2016-06-24 LAB — RAPID URINE DRUG SCREEN, HOSP PERFORMED
AMPHETAMINES: NOT DETECTED
BARBITURATES: NOT DETECTED
Benzodiazepines: NOT DETECTED
Cocaine: POSITIVE — AB
Opiates: POSITIVE — AB
TETRAHYDROCANNABINOL: POSITIVE — AB

## 2016-06-24 LAB — DIFFERENTIAL
BASOS ABS: 0.1 10*3/uL (ref 0.0–0.1)
Basophils Relative: 1 %
Eosinophils Absolute: 0.2 10*3/uL (ref 0.0–0.7)
Eosinophils Relative: 3 %
LYMPHS ABS: 3 10*3/uL (ref 0.7–4.0)
Lymphocytes Relative: 44 %
Monocytes Absolute: 0.5 10*3/uL (ref 0.1–1.0)
Monocytes Relative: 7 %
NEUTROS ABS: 3.2 10*3/uL (ref 1.7–7.7)
NEUTROS PCT: 45 %

## 2016-06-24 LAB — APTT: aPTT: 29 seconds (ref 24–36)

## 2016-06-24 LAB — I-STAT TROPONIN, ED: Troponin i, poc: 0 ng/mL (ref 0.00–0.08)

## 2016-06-24 LAB — PROTIME-INR
INR: 0.95
PROTHROMBIN TIME: 12.6 s (ref 11.4–15.2)

## 2016-06-24 MED ORDER — HYDROCODONE-ACETAMINOPHEN 5-325 MG PO TABS
ORAL_TABLET | ORAL | Status: AC
  Filled 2016-06-24: qty 1

## 2016-06-24 MED ORDER — HYDROCODONE-ACETAMINOPHEN 5-325 MG PO TABS
1.0000 | ORAL_TABLET | Freq: Once | ORAL | Status: AC
  Administered 2016-06-24: 1 via ORAL

## 2016-06-24 NOTE — ED Notes (Signed)
Pt back in waiting area 

## 2016-06-24 NOTE — ED Triage Notes (Signed)
Pt in c/o back pain, also c/o headache and blurry vision x4 days, states head hurts worse when looking out of both eyes, ambulatory without distress

## 2016-06-24 NOTE — ED Triage Notes (Signed)
Headache started 4 days ago.  Pain on right side of head.  Vision is change, blurry and sometime double.  Pain in back of right eye for 4 days.  Patient denies wearing glasses.  Patient keeps right eye shut so he can focus -reports depth perception is off-reports reaching for items and is "way off" from where item is sitting.

## 2016-06-24 NOTE — ED Provider Notes (Signed)
TIME SEEN:  By signing my name below, I, Tom Conley, attest that this documentation has been prepared under the direction and in the presence of [employer], DO.  Electronically Signed: Julien Conley, ED Scribe. 06/25/16. 12:14 AM.   CHIEF COMPLAINT:  Chief Complaint  Patient presents with  . Back Pain  . Headache  . Blurred Vision     HPI:  HPI Comments: Tom Conley is a 50 y.o. male who has a Pmhx of HTM, DM, chronic back pain who presents to the Emergency Department complaining of multiple complaints. He reports having intermittent, recurrent back pain that has been exacerbated by a cough that he has had for the past few days. Pt states he has been having chronic back pain for the past 6 years. He reports taking penicillin that was not prescribed to him to help with his cough with relief. There is no new injury noted to his back. Denies bowel/bladder incontinence, urinary retention, any new numbness or weakness.  States he always feels numb from the left knee to his left foot and over the past 3 months has had some numbness in the first 3 digits of the right hand. States he has been coughing up yellow sputum.  No SOB.  Pt further states having a sudden onset, constant, right sided headache x 4 days. Pt says he woke up with his headache and slept 1.5 days prior to his headache. He notes most of his pain is behind his right eye which he describes as aching. Associated blurry vision in his right eye. Reports vision in the left eye is normal. He has never had a headache similar to this before.  He states he has noticed that his right eye does not "move as much" as it normally does. He has taken advil and ibuprofen to alleviate his pain without relief. Pt states that he smokes cocaine and the last time that he used was two days ago, after his symptoms started. Pt does not wear glasses or contacts. There is no past hx of eye surgery or problems with his eyes. Denies fever, any injury to  his head or eyes, vision loss in right eye, or injury to his right eye, eye pain, flashes or floaters.   ROS: See HPI Constitutional: no fever  Eyes: no drainage  ENT: no runny nose   Cardiovascular:  no chest pain  Resp: no SOB  GI: no vomiting GU: no dysuria Integumentary: no rash  Allergy: no hives  Musculoskeletal: no leg swelling  Neurological: no slurred speech ROS otherwise negative  PAST MEDICAL HISTORY/PAST SURGICAL HISTORY:  Past Medical History:  Diagnosis Date  . Back pain   . Diabetes mellitus without complication ([place])    hypoglycemic  . Herniated disc   . Hypertension   . Seizures ([place])     MEDICATIONS:  Prior to Admission medications   Medication Sig Start Date End Date Taking? Authorizing Provider  azithromycin (ZITHROMAX Z-PAK) 250 MG tablet Use as directed Patient not taking: Reported on 06/24/2016 08/08/13   Liam Graham, PA-C  cyclobenzaprine (FLEXERIL) 10 MG tablet Take 1 tablet (10 mg total) by mouth 3 (three) times daily as needed for muscle spasms. Patient not taking: Reported on 06/24/2016 05/28/14   Olin Hauser, DO  diazepam (VALIUM) 5 MG tablet Take 1 tablet (5 mg total) by mouth every 6 (six) hours as needed for muscle spasms. Patient not taking: Reported on 06/24/2016 07/27/13   Baron Sane, PA-C  diclofenac ([place])  50 MG tablet Take 1 tablet (50 mg total) by mouth 3 (three) times daily. Patient not taking: Reported on 06/24/2016 08/10/13   Liam Graham, PA-C  ibuprofen (ADVIL,MOTRIN) 200 MG tablet Take 200-400 mg by mouth every 6 (six) hours as needed for moderate pain.    Historical Provider, MD  ibuprofen (ADVIL,MOTRIN) 800 MG tablet Take 1 tablet (800 mg total) by mouth every 6 (six) hours as needed for mild pain or moderate pain. 07/27/13   Jennifer Piepenbrink, PA-C  ketorolac (TORADOL) 10 MG tablet Take 1 tablet (10 mg total) by mouth every 6 (six) hours as needed. Patient not taking: Reported on 06/24/2016  01/24/16   Comer Locket, PA-C  oxyCODONE-acetaminophen (PERCOCET) 5-325 MG per tablet 1 to 2 tablets every 6 hours as needed for pain. Patient not taking: Reported on 06/24/2016 08/04/13   Harden Mo, MD  oxyCODONE-acetaminophen (PERCOCET/ROXICET) 5-325 MG per tablet Take 1-2 tablets by mouth every 6 (six) hours as needed for severe pain. Patient not taking: Reported on 06/24/2016 07/27/13   Antonietta Breach, PA-C  oxyCODONE-acetaminophen (ROXICET) 5-325 MG per tablet Take 1 tablet by mouth every 4 (four) hours as needed for severe pain. Patient not taking: Reported on 06/24/2016 08/08/13   Liam Graham, PA-C  polyethylene glycol powder (GLYCOLAX/MIRALAX) powder Take 17 g by mouth 2 (two) times daily. Until daily soft stools  OTC Patient not taking: Reported on 06/24/2016 07/27/13   Baron Sane, PA-C  PredniSONE 10 MG KIT 12 day dose pack taper PO Patient not taking: Reported on 06/24/2016 05/28/14   Olin Hauser, DO    ALLERGIES:  Allergies  Allergen Reactions  . Penicillins Hives    SOCIAL HISTORY:  Social History  Substance Use Topics  . Smoking status: Current Some Day Smoker    Packs/day: 0.25    Types: Cigarettes  . Smokeless tobacco: Never Used  . Alcohol use Yes    FAMILY HISTORY: Family History  Problem Relation Age of Onset  . Diabetes Mother     EXAM: BP 144/77 (BP Location: Right Arm)   Pulse 101   Temp 98.4 F (36.9 C) (Oral)   Resp 20   Ht _0  (1.753 m)   Wt 155 lb (70.3 kg)   SpO2 100%   BMI 22.89 kg/m  CONSTITUTIONAL: Alert and oriented and responds appropriately to questions. Chronically ill-appearing, in no significant distress HEAD: Normocephalic, atraumatic EYES: Conjunctivae clear, pupils are both 3 mm bilaterally and do not dilate when the lights are turned off, only very sluggishly reactive, extraocular movements intact and the left eye, patient has decreased lateral gaze in the right eye, possible mild ptosis of the  right eye, no facial swelling, erythema or warmth; No papilledema but exam very limited due to pupillary constriction, please see nursing notes for visual acuity, normal red reflex.  No corneal abrasion or ulceration.  Normal fluorescein staining.  No FB appreciated.   ENT: normal nose; no rhinorrhea; moist mucous membranes NECK: Supple, no meningismus, no nuchal rigidity, no LAD  CARD: RRR; S1 and S2 appreciated; no murmurs, no clicks, no rubs, no gallops RESP: Normal chest excursion without splinting or tachypnea; breath sounds clear and equal bilaterally; no wheezes, no rhonchi, no rales, no hypoxia or respiratory distress, speaking full sentences ABD/GI: Normal bowel sounds; non-distended; soft, non-tender, no rebound, no guarding, no peritoneal signs, no hepatosplenomegaly BACK:  The back appears normal and is non-tender to palpation, there is no CVA tenderness, no midline spinal tenderness or  step-off or deformity EXT: Normal ROM in all joints; non-tender to palpation; no edema; normal capillary refill; no cyanosis, no calf tenderness or swelling    SKIN: Normal color for age and race; warm; no rash NEURO: Moves all extremities equally, reports diminished sensation from the left knee down which is chronic for him but otherwise sensation to light touch intact diffusely, 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 Grooming and personal hygiene are appropriate.  MEDICAL DECISION MAKING: Patient here with complaints of headache, vision changes. I'm concerned the patient may have had a stroke. Symptoms started 4 days ago. Outside of TPA window. He appears to have lateral gaze deficit and ptosis of right eye with associated vision changes. We'll obtain CT of his head. Labs ordered in  triage of been unremarkable. Urine drug screen is positive for opiates, THC and cocaine. Opiates may be why his pupils are not reactive and are small. We'll give him Reglan, Benadryl for his headache.  Back pain seems to be chronic. No new focal neurologic deficits, bowel or bladder changes, fever to suggest cauda equina, epidural abscess or hematoma, discitis or transverse myelitis. Do not feel he needs emergent imaging of his back. No acute injury. Doubt fracture.  We'll also obtain chest x-ray for evaluation of his cough with yellow sputum production.  ED PROGRESS: 1:05 AM  Pt's chest x-ray shows no infiltrate. Head CT shows no acute abnormality. Discussed with neurology, Dr. Cheral Marker who will see the patient in consult. We appreciate his help.   5:00 AM  D/w Dr. Cheral Marker with neurology. He would like the patient have an MRI of his brain and be admitted to the medicine service. We'll discuss with medicine. Patient does not have a PCP.   5:30 AM  Discussed patient's case with hospitalist, Dr. Blaine Hamper.  Recommend admission to telemetry, observation bed.  I will place holding orders per their request. Patient and family (if present) updated with plan. Care transferred to hospitalist service.  I reviewed all nursing notes, vitals, pertinent old records, EKGs, labs, imaging (as available).    EKG Interpretation  Date/Time:  Thursday June 25 2016 01:30:10 EST Ventricular Rate:  62 PR Interval:    QRS Duration: 93 QT Interval:  400 QTC Calculation: 407 R Axis:   90 Text Interpretation:  Sinus rhythm Borderline short PR interval Borderline right axis deviation ST elev, probable normal early repol pattern No significant change since last tracing Confirmed by Nikol Lemar,  DO, Aayra Hornbaker [id number]) on 06/25/2016 1:43:27 AM        I personally performed the services described in this documentation, which was scribed in my presence. The recorded information has been reviewed and is accurate.    [place], DO 06/25/16 0700

## 2016-06-24 NOTE — ED Notes (Signed)
Pt refusing cardiac monitoring. Pt states nothing is wrong with his heart.

## 2016-06-24 NOTE — ED Notes (Signed)
Pt stated he was stepping out for a few minutes

## 2016-06-24 NOTE — ED Provider Notes (Signed)
[place]    CSN: [id number] Arrival date & time: 06/24/16  1806     History   Chief Complaint No chief complaint on file.   HPI Tom Conley is a 50 y.o. male.   HPI  He is a 50 year old man here for several concerns. He is very concerned about his chronic lower back pain. This radiates into bilateral buttocks and upper legs. He also has chronic left lower leg numbness. He also had a history of diabetes, hypertension, left-sided Bell's palsy, and prior stroke. He is not currently taking any medications due to loss of insurance. He also has a history of seizures and states he feels like he may be getting ready to have a seizure.  More concerning, 4 days ago he woke up with a right-sided headache and change of vision of the right eye. He reports blurred vision in the right eye. When he opens both of his eyes he has double vision. He has been closing the right eye which helps the headache some. Headache is located behind the right eye. He does report some weakness in the left leg, but thinks this is coming from his back. In addition to numbness in the left distal leg he has some numbness in his right fingers.  Past Medical History:  Diagnosis Date  . Back pain   . Diabetes mellitus without complication ([place])    hypoglycemic  . Herniated disc   . Hypertension   . Seizures ([place])     There are no active problems to display for this patient.   Past Surgical History:  Procedure Laterality Date  . fracture arm Left 1975   ? plate on ulna       Home Medications    Prior to Admission medications   Medication Sig Start Date End Date Taking? Authorizing Provider  azithromycin (ZITHROMAX Z-PAK) 250 MG tablet Use as directed Patient not taking: Reported on 06/24/2016 08/08/13   Liam Graham, PA-C  cyclobenzaprine (FLEXERIL) 10 MG tablet Take 1 tablet (10 mg total) by mouth 3 (three) times daily as needed for muscle spasms. Patient not taking: Reported on  06/24/2016 05/28/14   Olin Hauser, DO  diazepam (VALIUM) 5 MG tablet Take 1 tablet (5 mg total) by mouth every 6 (six) hours as needed for muscle spasms. Patient not taking: Reported on 06/24/2016 07/27/13   Baron Sane, PA-C  diclofenac (CATAFLAM) 50 MG tablet Take 1 tablet (50 mg total) by mouth 3 (three) times daily. Patient not taking: Reported on 06/24/2016 08/10/13   Liam Graham, PA-C  ibuprofen (ADVIL,MOTRIN) 200 MG tablet Take 200-400 mg by mouth every 6 (six) hours as needed for moderate pain.    Historical Provider, MD  ibuprofen (ADVIL,MOTRIN) 800 MG tablet Take 1 tablet (800 mg total) by mouth every 6 (six) hours as needed for mild pain or moderate pain. 07/27/13   Jennifer Piepenbrink, PA-C  ketorolac (TORADOL) 10 MG tablet Take 1 tablet (10 mg total) by mouth every 6 (six) hours as needed. Patient not taking: Reported on 06/24/2016 01/24/16   Comer Locket, PA-C  oxyCODONE-acetaminophen (PERCOCET) 5-325 MG per tablet 1 to 2 tablets every 6 hours as needed for pain. Patient not taking: Reported on 06/24/2016 08/04/13   Harden Mo, MD  oxyCODONE-acetaminophen (PERCOCET/ROXICET) 5-325 MG per tablet Take 1-2 tablets by mouth every 6 (six) hours as needed for severe pain. Patient not taking: Reported on 06/24/2016 07/27/13   Antonietta Breach, PA-C  oxyCODONE-acetaminophen (ROXICET) [phone]  MG per tablet Take 1 tablet by mouth every 4 (four) hours as needed for severe pain. Patient not taking: Reported on 06/24/2016 08/08/13   Liam Graham, PA-C  polyethylene glycol powder (GLYCOLAX/MIRALAX) powder Take 17 g by mouth 2 (two) times daily. Until daily soft stools  OTC Patient not taking: Reported on 06/24/2016 07/27/13   Baron Sane, PA-C  PredniSONE 10 MG KIT 12 day dose pack taper PO Patient not taking: Reported on 06/24/2016 05/28/14   Olin Hauser, DO    Family History Family History  Problem Relation Age of Onset  . Diabetes Mother      Social History Social History  Substance Use Topics  . Smoking status: Current Some Day Smoker    Packs/day: 0.25    Types: Cigarettes  . Smokeless tobacco: Never Used  . Alcohol use Yes     Allergies   Penicillins   Review of Systems Review of Systems As in history of present illness  Physical Exam Triage Vital Signs ED Triage Vitals  Enc Vitals Group     BP      Pulse      Resp      Temp      Temp src      SpO2      Weight      Height      Head Circumference      Peak Flow      Pain Score      Pain Loc      Pain Edu?      Excl. in [place]?    No data found.   Updated Vital Signs BP (!) 198/109 (BP Location: Left Arm)   Pulse 109   Resp 24   SpO2 98%   Visual Acuity Right Eye Distance:   Left Eye Distance:   Bilateral Distance:    Right Eye Near:   Left Eye Near:    Bilateral Near:     Physical Exam  Constitutional: He is oriented to person, place, and time. He appears well-developed and well-nourished. No distress.  Eyes: Pupils are equal, round, and reactive to light.  Right eye does not fully abduct.  Cardiovascular: Normal rate.   Pulmonary/Chest: Effort normal.  Neurological: He is alert and oriented to person, place, and time. A cranial nerve deficit (right CN VI) is present. He exhibits normal muscle tone. Coordination normal.     UC Treatments / Results  Labs (all labs ordered are listed, but only abnormal results are displayed) Labs Reviewed - No data to display  EKG  EKG Interpretation None       Radiology No results found.  Procedures Procedures (including critical care time)  Medications Ordered in UC Medications  HYDROcodone-acetaminophen (NORCO/VICODIN) 5-325 MG per tablet 1 tablet (1 tablet Oral Given 06/24/16 1904)     Initial Impression / Assessment and Plan / UC Course  I have reviewed the triage vital signs and the nursing notes.  Pertinent labs & imaging results that were available during my care of the  patient were reviewed by me and considered in my medical decision making (see chart for details).  Clinical Course     We'll give a Vicodin here for his back pain as he is very concerned about this. Transfer to [facility] ED via shuttle for evaluation of headache and vision problem. Concern for new stroke.  Final Clinical Impressions(s) / UC Diagnoses   Final diagnoses:  Acute intractable headache, unspecified headache type  Blurred  vision, right eye  Chronic bilateral low back pain with left-sided sciatica    New Prescriptions Discharge Medication List as of 06/24/2016  7:06 PM       Melony Overly, MD 06/24/16 1907

## 2016-06-25 ENCOUNTER — Observation Stay (HOSPITAL_COMMUNITY): Payer: Self-pay

## 2016-06-25 ENCOUNTER — Encounter (HOSPITAL_COMMUNITY): Payer: Self-pay | Admitting: *Deleted

## 2016-06-25 ENCOUNTER — Emergency Department (HOSPITAL_COMMUNITY): Payer: Self-pay

## 2016-06-25 DIAGNOSIS — M549 Dorsalgia, unspecified: Secondary | ICD-10-CM

## 2016-06-25 DIAGNOSIS — F1910 Other psychoactive substance abuse, uncomplicated: Secondary | ICD-10-CM | POA: Diagnosis present

## 2016-06-25 DIAGNOSIS — H4921 Sixth [abducent] nerve palsy, right eye: Secondary | ICD-10-CM

## 2016-06-25 DIAGNOSIS — R519 Headache, unspecified: Secondary | ICD-10-CM | POA: Diagnosis present

## 2016-06-25 DIAGNOSIS — I10 Essential (primary) hypertension: Secondary | ICD-10-CM | POA: Diagnosis present

## 2016-06-25 DIAGNOSIS — G8929 Other chronic pain: Secondary | ICD-10-CM | POA: Diagnosis present

## 2016-06-25 DIAGNOSIS — R51 Headache: Secondary | ICD-10-CM

## 2016-06-25 DIAGNOSIS — R739 Hyperglycemia, unspecified: Secondary | ICD-10-CM

## 2016-06-25 DIAGNOSIS — Z87898 Personal history of other specified conditions: Secondary | ICD-10-CM

## 2016-06-25 DIAGNOSIS — H539 Unspecified visual disturbance: Secondary | ICD-10-CM

## 2016-06-25 LAB — SEDIMENTATION RATE: Sed Rate: 18 mm/hr — ABNORMAL HIGH (ref 0–16)

## 2016-06-25 MED ORDER — TETRACAINE HCL 0.5 % OP SOLN
2.0000 [drp] | Freq: Once | OPHTHALMIC | Status: AC
Start: 2016-06-25 — End: 2016-06-25
  Administered 2016-06-25: 2 [drp] via OPHTHALMIC
  Filled 2016-06-25: qty 2

## 2016-06-25 MED ORDER — FLUORESCEIN SODIUM 1 MG OP STRP
1.0000 | ORAL_STRIP | Freq: Once | OPHTHALMIC | Status: AC
Start: 2016-06-25 — End: 2016-06-25
  Administered 2016-06-25: 1 via OPHTHALMIC
  Filled 2016-06-25: qty 1

## 2016-06-25 MED ORDER — LIDOCAINE-EPINEPHRINE-TETRACAINE (LET) SOLUTION
3.0000 mL | Freq: Once | NASAL | Status: DC
  Filled 2016-06-25: qty 3

## 2016-06-25 MED ORDER — SODIUM CHLORIDE 0.9 % IV SOLN
INTRAVENOUS | Status: DC
  Administered 2016-06-25: 10:00:00 via INTRAVENOUS

## 2016-06-25 MED ORDER — LORAZEPAM 2 MG/ML IJ SOLN
1.0000 mg | Freq: Once | INTRAMUSCULAR | Status: AC
  Administered 2016-06-25: 1 mg via INTRAVENOUS
  Filled 2016-06-25: qty 1

## 2016-06-25 MED ORDER — DIPHENHYDRAMINE HCL 50 MG/ML IJ SOLN
25.0000 mg | Freq: Once | INTRAMUSCULAR | Status: AC
  Administered 2016-06-25: 25 mg via INTRAVENOUS
  Filled 2016-06-25: qty 1

## 2016-06-25 MED ORDER — FAMOTIDINE IN NACL 20-0.9 MG/50ML-% IV SOLN
20.0000 mg | Freq: Two times a day (BID) | INTRAVENOUS | Status: DC
  Administered 2016-06-25: 20 mg via INTRAVENOUS
  Filled 2016-06-25: qty 50

## 2016-06-25 MED ORDER — HYDRALAZINE HCL 20 MG/ML IJ SOLN: 10.0000 mg | Freq: Four times a day (QID) | INTRAMUSCULAR | Status: DC | PRN

## 2016-06-25 MED ORDER — METOCLOPRAMIDE HCL 5 MG/ML IJ SOLN
10.0000 mg | Freq: Once | INTRAMUSCULAR | Status: AC
  Administered 2016-06-25: 10 mg via INTRAVENOUS
  Filled 2016-06-25: qty 2

## 2016-06-25 MED ORDER — DEXAMETHASONE SODIUM PHOSPHATE 10 MG/ML IJ SOLN
10.0000 mg | Freq: Once | INTRAMUSCULAR | Status: AC
  Administered 2016-06-25: 10 mg via INTRAVENOUS
  Filled 2016-06-25: qty 1

## 2016-06-25 MED ORDER — KETOROLAC TROMETHAMINE 30 MG/ML IJ SOLN
30.0000 mg | Freq: Once | INTRAMUSCULAR | Status: AC
  Administered 2016-06-25: 30 mg via INTRAVENOUS
  Filled 2016-06-25: qty 1

## 2016-06-25 MED ORDER — LORAZEPAM 2 MG/ML IJ SOLN: 1.0000 mg | Freq: Once | INTRAMUSCULAR | Status: DC

## 2016-06-25 NOTE — H&P (Signed)
History and Physical    Tom Conley DOB: 06/22/1966 DOA: 06/24/2016   PCP: No PCP Per Patient   Patient coming from/Resides with: Private residence  Admission status: Observation/telemetry -medically necessary to stay a minimum 2 midnights to rule out impending and/or unexpected changes in physiologic status that may differ from initial evaluation performed in the ER and/or at time of admission. Presents with headache and vision changes with neurologist recommending MRI and LP  Chief Complaint: Headache and focal vision changes  HPI: Tom Conley is a 50 y.o. male with medical history significant for hypertension currently not on medications, reported diabetes not on medications, left-sided Bell's palsy and prior stroke. He apparently does not take medications because of loss of insurance he reported to the initial intake physician at the urgent care that he has a history of seizures and felt like he may be getting ready to have a seizure. Of note, history has been obtained from the chart since during my encounter with the patient he woke up briefly but then went back to sleep and would not participate with exam. Patient reports 4 days of right-sided headache with vision changes in the right eye associated with blurry vision and double vision in both eyes. He is photophobic noting opening right eye increases his headache. He reported left leg weakness. Because his symptoms he was transferred to [facility] and has subsequently been evaluated by the neuro hospitalist. On exam he was without papilledema but was found to have positive right lateral gaze palsy in the right eye with ptosis with small pupils consistent with possible opiate effect. CT of the head was normal. Recommendations were for MRI of the head and lumbar puncture. In addition his urine drug screen was positive for opiates, cocaine and marijuana. Alcohol level was less than 5. In addition he had reported to the  urgent care physician has been on penicillin for a cough which has not improved. Chest x-ray was obtained that demonstrated mild peribronchial thickening but no focal infiltrate or evidence of pneumonia.  ED Course:  Vital Signs: BP 135/93   Pulse 72   Temp 98.4 F (36.9 C) (Oral)   Resp 18   Ht 5' 9" (1.753 m)   Wt 70.3 kg (155 lb)   SpO2 98%   BMI 22.89 kg/m  CT head without contrast: Unremarkable MRI brain: Pending 2 view chest x-ray: Mild peribronchial thickening without evidence of opacity or pneumonia Lab data: Sodium 141, potassium 4.2, chloride 107, BUN 11, creatinine 1.2, glucose 133, anion gap 6, albumin 3.4, LFTs normal, poc troponin 0.00, white count 6900 with normal differential, hemoglobin 13.2, MCV 100.3, platelets 276,000, coags normal, urinalysis unremarkable except for mildly concentrated urine with a specific gravity 1.025, alcohol level less than 5, urine drug screen positive for opiates, cocaine and marijuana Medications and treatments: Reglan 10 mg IV 1, Benadryl 25 mg IV 1, Ativan 1 mg IV 1  Review of Systems:  **Unable to obtain directly from the patient given his lack of participation with exam upon my evaluation. Please see history of present illness and review notes from urgent care and ER physicians   Past Medical History:  Diagnosis Date  . Back pain   . Diabetes mellitus without complication ([place])    hypoglycemic  . Herniated disc   . Hypertension   . Seizures ([place])     Past Surgical History:  Procedure Laterality Date  . fracture arm Left 1975   ? plate on ulna  Social History   Social History  . Marital status: Single    Spouse name: N/A  . Number of children: N/A  . Years of education: N/A   Occupational History  . Not on file.   Social History Main Topics  . Smoking status: Current Some Day Smoker    Packs/day: 0.25    Types: Cigarettes  . Smokeless tobacco: Never Used  . Alcohol use Yes  . Drug use: No  . Sexual  activity: Yes    Birth control/ protection: Condom   Other Topics Concern  . Not on file   Social History Narrative  . No narrative on file    Mobility: Without assistive devices Work history: Unemployed   Allergies  Allergen Reactions  . Penicillins Hives    Family History  Problem Relation Age of Onset  . Diabetes Mother      Prior to Admission medications   Medication Sig Start Date End Date Taking? Authorizing Provider  azithromycin (ZITHROMAX Z-PAK) 250 MG tablet Use as directed Patient not taking: Reported on 06/25/2016 08/08/13   Liam Graham, PA-C  cyclobenzaprine (FLEXERIL) 10 MG tablet Take 1 tablet (10 mg total) by mouth 3 (three) times daily as needed for muscle spasms. Patient not taking: Reported on 06/25/2016 05/28/14   Olin Hauser, DO  diazepam (VALIUM) 5 MG tablet Take 1 tablet (5 mg total) by mouth every 6 (six) hours as needed for muscle spasms. Patient not taking: Reported on 06/25/2016 07/27/13   Baron Sane, PA-C  diclofenac (CATAFLAM) 50 MG tablet Take 1 tablet (50 mg total) by mouth 3 (three) times daily. Patient not taking: Reported on 06/25/2016 08/10/13   Liam Graham, PA-C  ibuprofen (ADVIL,MOTRIN) 800 MG tablet Take 1 tablet (800 mg total) by mouth every 6 (six) hours as needed for mild pain or moderate pain. Patient not taking: Reported on 06/25/2016 07/27/13   Baron Sane, PA-C  ketorolac (TORADOL) 10 MG tablet Take 1 tablet (10 mg total) by mouth every 6 (six) hours as needed. Patient not taking: Reported on 06/25/2016 01/24/16   Comer Locket, PA-C  oxyCODONE-acetaminophen (PERCOCET) 5-325 MG per tablet 1 to 2 tablets every 6 hours as needed for pain. Patient not taking: Reported on 06/25/2016 08/04/13   Harden Mo, MD  oxyCODONE-acetaminophen (PERCOCET/ROXICET) 5-325 MG per tablet Take 1-2 tablets by mouth every 6 (six) hours as needed for severe pain. Patient not taking: Reported on 06/25/2016  07/27/13   Antonietta Breach, PA-C  oxyCODONE-acetaminophen (ROXICET) 5-325 MG per tablet Take 1 tablet by mouth every 4 (four) hours as needed for severe pain. Patient not taking: Reported on 06/25/2016 08/08/13   Liam Graham, PA-C  polyethylene glycol powder (GLYCOLAX/MIRALAX) powder Take 17 g by mouth 2 (two) times daily. Until daily soft stools  OTC Patient not taking: Reported on 06/25/2016 07/27/13   Baron Sane, PA-C  PredniSONE 10 MG KIT 12 day dose pack taper PO Patient not taking: Reported on 06/25/2016 05/28/14   Olin Hauser, DO    Physical Exam: Vitals:   06/25/16 0430 06/25/16 0500 06/25/16 0530 06/25/16 0600  BP: 132/85 126/89 139/99 135/93  Pulse: 63 65 68 72  Resp: _0 Temp:      TempSrc:      SpO2: 98% 99% 99% 98%  Weight:      Height:          Constitutional:Sleeping and in no acute distress wakes up but goes back to sleep  Eyes: Unable to adequately assess although does appear to have right eye drooping ENMT: Mucous membranes appear dry grossinspection. Normal dentition.  Neck: normal, supple, no masses, no thyromegaly Respiratory: clear to auscultation bilaterally, no wheezing, no crackles. Normal respiratory effort. No accessory muscle use.  Cardiovascular: Regular rate and rhythm, no murmurs / rubs / gallops. No extremity edema. 2+ pedal pulses. No carotid bruits.  Abdomen: no tenderness, no masses palpated. No hepatosplenomegaly. Bowel sounds positive.  Musculoskeletal: no clubbing / cyanosis. No joint deformity upper and lower extremities. Good ROM, no contractures. Normal muscle tone.  Skin: no rashes, lesions, ulcers. No induration Neurologic: CN 2-12 grossly intact except for above mentioned right eye drooping although exam limited by patient's lack of participate in. Sensation appears to be intact based on gross examination, DTR equivocal, unable to test strength due to patient's lack of participate in  Psychiatric: Unable to  adequately test due to patient's lack of purchase patient-patient did receive Ativan and I'm uncertain if this is contributing to patient's current sleepiness although he does awaken to touch but goes back to sleep; he did answer two questions regarding his presenting symptomatology.   Labs on Admission: I have personally reviewed following labs and imaging studies  CBC:  Recent Labs Lab 06/24/16 2034 06/24/16 2042  WBC 6.9  --   NEUTROABS 3.2  --   HGB 13.2 13.3  HCT 39.8 39.0  MCV 100.3*  --   PLT 276  --    Basic Metabolic Panel:  Recent Labs Lab 06/24/16 2034 06/24/16 2042  NA 141 143  K 4.2 4.1  CL 107 105  CO2 28  --   GLUCOSE 133* 129*  BUN 11 14  CREATININE 1.02 1.00  CALCIUM 9.4  --    GFR: Estimated Creatinine Clearance: 89.8 mL/min (by C-G formula based on SCr of 1 mg/dL). Liver Function Tests:  Recent Labs Lab 06/24/16 2034  AST 17  ALT 15*  ALKPHOS 94  BILITOT 0.4  PROT 6.7  ALBUMIN 3.4*   No results for input(s): LIPASE, AMYLASE in the last 168 hours. No results for input(s): AMMONIA in the last 168 hours. Coagulation Profile:  Recent Labs Lab 06/24/16 2034  INR 0.95   Cardiac Enzymes: No results for input(s): CKTOTAL, CKMB, CKMBINDEX, TROPONINI in the last 168 hours. BNP (last 3 results) No results for input(s): PROBNP in the last 8760 hours. HbA1C: No results for input(s): HGBA1C in the last 72 hours. CBG: No results for input(s): GLUCAP in the last 168 hours. Lipid Profile: No results for input(s): CHOL, HDL, LDLCALC, TRIG, CHOLHDL, LDLDIRECT in the last 72 hours. Thyroid Function Tests: No results for input(s): TSH, T4TOTAL, FREET4, T3FREE, THYROIDAB in the last 72 hours. Anemia Panel: No results for input(s): VITAMINB12, FOLATE, FERRITIN, TIBC, IRON, RETICCTPCT in the last 72 hours. Urine analysis:    Component Value Date/Time   COLORURINE YELLOW 06/24/2016 2005   APPEARANCEUR CLEAR 06/24/2016 2005   LABSPEC 1.025  06/24/2016 2005   PHURINE 5.0 06/24/2016 2005   GLUCOSEU NEGATIVE 06/24/2016 2005   HGBUR NEGATIVE 06/24/2016 2005   BILIRUBINUR NEGATIVE 06/24/2016 2005   KETONESUR NEGATIVE 06/24/2016 2005   PROTEINUR NEGATIVE 06/24/2016 2005   UROBILINOGEN 0.2 07/27/2013 1639   NITRITE NEGATIVE 06/24/2016 2005   LEUKOCYTESUR NEGATIVE 06/24/2016 2005   Sepsis Labs: '@LABRCNTIP'$ (procalcitonin:4,lacticidven:4) )No results found for this or any previous visit (from the past 240 hour(s)).   Radiological Exams on Admission: Dg Chest 2 View  Result Date: 06/25/2016 CLINICAL DATA:  Acute onset of right-sided headache and right-sided visual changes. Productive cough. Initial encounter. EXAM: CHEST  2 VIEW COMPARISON:  Chest radiograph performed 08/10/2013 FINDINGS: The lungs are well-aerated. Mild peribronchial thickening is noted. There is no evidence of focal opacification, pleural effusion or pneumothorax. The heart is normal in size; the mediastinal contour is within normal limits. No acute osseous abnormalities are seen. IMPRESSION: Mild peribronchial thickening noted.  Lungs remain grossly clear. Electronically Signed   By: Garald Balding M.D.   On: 06/25/2016 00:38   Ct Head Wo Contrast  Result Date: 06/25/2016 CLINICAL DATA:  Acute onset of right-sided headache. Right-sided visual changes. Productive cough. Initial encounter. EXAM: CT HEAD WITHOUT CONTRAST TECHNIQUE: Contiguous axial images were obtained from the base of the skull through the vertex without intravenous contrast. COMPARISON:  CT of the head performed 07/25/2013 FINDINGS: Brain: No evidence of acute infarction, hemorrhage, hydrocephalus, extra-axial collection or mass lesion/mass effect. The posterior fossa, including the cerebellum, brainstem and fourth ventricle, is within normal limits. The third and lateral ventricles, and basal ganglia are unremarkable in appearance. The cerebral hemispheres are symmetric in appearance, with normal  gray-white differentiation. No mass effect or midline shift is seen. Vascular: No hyperdense vessel or unexpected calcification. Skull: There is no evidence of fracture; visualized osseous structures are unremarkable in appearance. Sinuses/Orbits: The orbits are within normal limits. The paranasal sinuses and mastoid air cells are well-aerated. Other: No significant soft tissue abnormalities are seen. IMPRESSION: Unremarkable noncontrast CT of the head. Electronically Signed   By: Garald Balding M.D.   On: 06/25/2016 00:37    EKG: (Independently reviewed) sinus rhythm with ventricular rate 62 bpm, QTC 407 ms, chronic ST elevation in septal leads, elevated J point in inferior lateral leads, voltage criteria met for LVH.  Assessment/Plan Principal Problem:   Vision changes / Headache -Presents with headache and visual changes for 4 days with negative CT. Recent upper respiratory infections so could be related to Bell's palsy -Neurology has seen in consultation and recommends MRI of the brain followed by LP especially with associated headache -Toradol 30 mg now then every 6 hours as needed for pain and headache -Decadron 10 mg IV now for headache -Avoid narcotics -Environmental treatments consisting of room darkening and decreased stimulation  Active Problems:   Acute hyperglycemia -Reported history of diabetes but currently no medications -Hemoglobin A1c    Chronic back pain -MRI performed in 2013 revealed lumbar changes of degeneration but without HNP -Avoid narcotics -Tylenol and Toradol for pain    Polysubstance abuse -Urine drug screen positive for opiates (did not receive an ER and based on medication reconciliation patient states is not taking prescribed narcotics), cocaine and marijuana    HTN (hypertension) -Initial blood pressure at presentation was 198/109 with tachycardia -BP improved after given Ativan -Hypertension could also explain patient's presentation symptoms of  headache and visual changes -Consider drug withdrawal and the differential -prn hydralazine -Likely has long-standing untreated hypertension given EKG changes concerning for LVH; currently does not have heart failure symptoms    History of seizures -Not on antiepileptic drugs prior to admission -Suspect prior seizures related to substance abuse withdrawal      DVT prophylaxis: SCDs  Code Status: Full based on previous admissions Family Communication: No friends or family at bedside at time of admission Disposition Plan: Anticipate discharge back to preadmission home environment once workup completed and deemed medically stable Consults called: Neurology/[place]    Deoni Cosey L. ANP-BC [facility] Pager [phone]   If  7PM-7AM, please contact night-coverage [email or web address] Password [email or web address]  06/25/2016, 7:30 AM

## 2016-06-25 NOTE — ED Notes (Signed)
Regular diet was ordered for patient. 

## 2016-06-25 NOTE — ED Notes (Signed)
Pt states he is ready to home - "people are grouchy here and won't feed me" Dr. Margot AblesMerrill in room, talked with pt-- breakfast ordered, explained to pt that he can have crackers and peanut butter and something to drink.

## 2016-06-25 NOTE — ED Notes (Signed)
Patient spoke with someone regarding his dog got out of his backyard and needs to leave hospital.  He is the only person the dog listens to.  States will leave but will come back to the ED.

## 2016-06-25 NOTE — ED Notes (Signed)
Paged admit Doctor.  

## 2016-06-25 NOTE — ED Notes (Signed)
IR called stated will send for patient in approximately 45 minutes for lumbar puncture.

## 2016-06-25 NOTE — ED Notes (Signed)
Neuro @ bedside

## 2016-06-25 NOTE — Consult Note (Addendum)
NEURO HOSPITALIST CONSULT NOTE   Requestig physician: Dr. Elesa MassedWard  Reason for Consult: Ocular motility deficit  History obtained from:  Patient     HPI:                                                                                                                                          Tom Conley is an 50 y.o. male who [facility] with multiple complaints, including LBP and headache. Tox screen was positive for opiates, cocaine and THC. EtOH level was < 5. ED physician's exam revealed no papilledema, but was positive for right lateral gaze palsy in right eye and ptosis. Small pupils were consistent with possible opiate effect. CT head was normal.   Past Medical History:  Diagnosis Date  . Back pain   . Diabetes mellitus without complication (HCC)    hypoglycemic  . Herniated disc   . Hypertension   . Seizures (HCC)     Past Surgical History:  Procedure Laterality Date  . fracture arm Left 1975   ? plate on ulna    Family History  Problem Relation Age of Onset  . Diabetes Mother     Social History:  reports that he has been smoking Cigarettes.  He has been smoking about 0.25 packs per day. He has never used smokeless tobacco. He reports that he drinks alcohol. He reports that he does not use drugs.  Allergies  Allergen Reactions  . Penicillins Hives    MEDICATIONS:                                                                                                                     No outpatient prescriptions have been marked as taking for the 06/24/16 encounter [facility](Hospital Encounter).    ROS:  History obtained from patient. Positive for double vision, right facial weakness, right facial sensory numbness. No chest pain, abdominal pain. Positive for sensory numbness along the anterior aspect of the left leg  below the knee.   Blood pressure 144/77, pulse 101, temperature 98.4 F (36.9 C), temperature source Oral, resp. rate 20, height 5\' 9"  (1.753 m), weight 70.3 kg (155 lb), SpO2 100 %.   General Examination:                                                                                                      HEENT-  Normocephalic/atraumatic.  Lungs- Respirations unlabored. No gross wheezing noted.  Extremities- Warm and well-perfused  Neurological Examination Mental Status: Alert, oriented, thought content appropriate.  Speech fluent without evidence of aphasia.  Able to follow all commands without difficulty. Cranial Nerves: II: Visual fields intact to confrontation bilaterally. Pupils equal, round and reactive to light.  III,IV, VI: No ptosis noted. Right lateral rectus palsy noted. EOM otherwise intact. No nystagmus.  V,VII: Mild facial asymmetry without clear weakness. Decreased facial temperature sensation on the right VIII: hearing intact to conversation IX,X: uvula rises symmetrically XI: bilateral shoulder shrug is symmetric XII: midline tongue extension Motor: Right : Upper extremity   5/5    Left:     Upper extremity   5/5  Lower extremity   5/5     Lower extremity   5/5 Normal tone throughout; no atrophy noted Sensory: Decreased temperature and FT sensation along anterior aspect of left leg below the knee. Sensation otherwise normal.  Deep Tendon Reflexes: 2+ and symmetric in upper and lower extremities without asymmetry Plantars: Right: downgoing   Left: downgoing Cerebellar: Normal FNF bilaterally Gait: Deferred  Lab Results: Basic Metabolic Panel:  Recent Labs Lab 06/24/16 2034 06/24/16 2042  NA 141 143  K 4.2 4.1  CL 107 105  CO2 28  --   GLUCOSE 133* 129*  BUN 11 14  CREATININE 1.02 1.00  CALCIUM 9.4  --     Liver Function Tests:  Recent Labs Lab 06/24/16 2034  AST 17  ALT 15*  ALKPHOS 94  BILITOT 0.4  PROT 6.7  ALBUMIN 3.4*   No results for  input(s): LIPASE, AMYLASE in the last 168 hours. No results for input(s): AMMONIA in the last 168 hours.  CBC:  Recent Labs Lab 06/24/16 2034 06/24/16 2042  WBC 6.9  --   NEUTROABS 3.2  --   HGB 13.2 13.3  HCT 39.8 39.0  MCV 100.3*  --   PLT 276  --     Cardiac Enzymes: No results for input(s): CKTOTAL, CKMB, CKMBINDEX, TROPONINI in the last 168 hours.  Lipid Panel: No results for input(s): CHOL, TRIG, HDL, CHOLHDL, VLDL, LDLCALC in the last 168 hours.  CBG: No results for input(s): GLUCAP in the last 168 hours.  Microbiology: No results found for this or any previous visit.  Coagulation Studies:  Recent Labs  06/24/16 2034  LABPROT 12.6  INR 0.95    Imaging: Dg Chest 2 View  Result Date: 06/25/2016 CLINICAL DATA:  Acute onset of right-sided headache and right-sided visual changes. Productive cough. Initial encounter. EXAM: CHEST  2 VIEW COMPARISON:  Chest radiograph performed 08/10/2013 FINDINGS: The lungs are well-aerated. Mild peribronchial thickening is noted. There is no evidence of focal opacification, pleural effusion or pneumothorax. The heart is normal in size; the mediastinal contour is within normal limits. No acute osseous abnormalities are seen. IMPRESSION: Mild peribronchial thickening noted.  Lungs remain grossly clear. Electronically Signed   By: Roanna Raider M.D.   On: 06/25/2016 00:38   Ct Head Wo Contrast  Result Date: 06/25/2016 CLINICAL DATA:  Acute onset of right-sided headache. Right-sided visual changes. Productive cough. Initial encounter. EXAM: CT HEAD WITHOUT CONTRAST TECHNIQUE: Contiguous axial images were obtained from the base of the skull through the vertex without intravenous contrast. COMPARISON:  CT of the head performed 07/25/2013 FINDINGS: Brain: No evidence of acute infarction, hemorrhage, hydrocephalus, extra-axial collection or mass lesion/mass effect. The posterior fossa, including the cerebellum, brainstem and fourth ventricle,  is within normal limits. The third and lateral ventricles, and basal ganglia are unremarkable in appearance. The cerebral hemispheres are symmetric in appearance, with normal gray-white differentiation. No mass effect or midline shift is seen. Vascular: No hyperdense vessel or unexpected calcification. Skull: There is no evidence of fracture; visualized osseous structures are unremarkable in appearance. Sinuses/Orbits: The orbits are within normal limits. The paranasal sinuses and mastoid air cells are well-aerated. Other: No significant soft tissue abnormalities are seen. IMPRESSION: Unremarkable noncontrast CT of the head. Electronically Signed   By: Roanna Raider M.D.   On: 06/25/2016 00:37   Assessment: 1. Right sided headache. Rated as 10/10. DDx includes intracranial lesion, status migrainosus, persistent tension type headache.  2. Right lateral rectus palsy. May be related to #1 above. DDx includes small right brainstem or cranial nerve VI infarction, inflammatory etiologies such as neurosarcoidosis, small compressive mass lesion and infectious etiologies such as Lyme disease. CT head negative. Will need to obtain MRI to rule out potential lesion not detectable by CT.  3. 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 Most likely secondary to radiculopathy in the setting of LBP and DDD.  4. Moderate low back pain.  5. Polysubstance abuse.   Recommendations: 1. MRI brain with and without contrast to assess for possible enhancing intracranial lesion along the course of the right abducens nerve.  2. Lumbar puncture to rule out inflammatory etiology. Would obtain EBV and VZV PCR, cell count, protein, albumin, IgG level, glucose. Additional 5 cc for cytology.   3. Also obtain serum albumin and IgG level so that the IgG index can be calculated for CSF.  4. Consider lumbar spine MRI with and without contrast.  5. PT. 6. Substance abuse counseling.  7. Trial of Phenergan 25 mg IV x 1 for  headache.  8. HIV testing.   Electronically signed: Dr. Caryl Pina 06/25/2016, 1:10 AM

## 2016-06-25 NOTE — ED Notes (Signed)
MRI called will call when ready for patient to be transported to MRI and administer medication.

## 2016-06-25 NOTE — Progress Notes (Signed)
This is a no charge note   Pending on admission per Dr. Elesa MassedWard   50 year old male with past medical history of seizure, chronic back pain, polysubstance abuse, hypertension, diabetes mellitus, who presents with right-sided headache, blurry vision, double vision for 4 days. CT head is negative for acute intracranial abnormalities. No leukocytosis and fever. Blood pressure 198/109 which improved to 129/87 currently. neurology was consulted. MRI pending.   Lorretta HarpXilin Madylyn Insco, MD  [facility] Pager [phone]  If 7PM-7AM, please contact night-coverage [email or web address] Password [email or web address] 06/25/2016, 5:17 AM

## 2016-06-25 NOTE — ED Notes (Signed)
Per Clydie BraunKaren, Charity fundraiserN.  Patient has been pulling leads, bp, pulse ox off.  He gets very agitated when he has them on.   Will let patient sleep, then get a set of vitals on patient.

## 2016-06-25 NOTE — ED Notes (Signed)
Spoke with Dr Konrad DoloresMerrell regarding patient stated he needs to leave. Ordered patient to stay if not sign out AMA.

## 2016-06-25 NOTE — ED Notes (Signed)
Pt refusing EKG

## 2016-06-25 NOTE — Discharge Summary (Signed)
Physician Discharge Summary  Tom Conley:[id number] DOB: 1965-08-28 DOA: 06/24/2016  PCP: No PCP Per Patient  Admit date: 06/24/2016 Discharge date: 06/25/2016  Time spent: 30 minutes  Recommendations for Outpatient Follow-up:  1. LEFT AMA   Discharge Diagnoses:  Principal Problem:   Vision changes Active Problems:   Headache   Acute hyperglycemia   Chronic back pain   Polysubstance abuse   HTN (hypertension)   History of seizures   Discharge Condition: STABLE  Diet recommendation: As prior to admit  [facility] Weights   06/24/16 2004  Weight: 70.3 kg (155 lb)    History of present illness:  Tom Conley is a 50 y.o. male with medical history significant for hypertension currently not on medications, reported diabetes not on medications, left-sided Bell's palsy and prior stroke. He apparently does not take medications because of loss of insurance he reported to the initial intake physician at the urgent care that he has a history of seizures and felt like he may be getting ready to have a seizure. Of note, history has been obtained from the chart since during my encounter with the patient he woke up briefly but then went back to sleep and would not participate with exam. Patient reports 4 days of right-sided headache with vision changes in the right eye associated with blurry vision and double vision in both eyes. He is photophobic noting opening right eye increases his headache. He reported left leg weakness. Because his symptoms he was transferred to [facility] and has subsequently been evaluated by the neuro hospitalist. On exam he was without papilledema but was found to have positive right lateral gaze palsy in the right eye with ptosis with small pupils consistent with possible opiate effect. CT of the head was normal. Recommendations were for MRI of the head and lumbar puncture. In addition his urine drug screen was positive for opiates, cocaine and marijuana. Alcohol  level was less than 5. In addition he had reported to the urgent care physician has been on penicillin for a cough which has not improved. Chest x-ray was obtained that demonstrated mild peribronchial thickening but no focal infiltrate or evidence of pneumonia.  Hospital Course:  Vision changes / Headache -Presented with headache and visual changes for 4 days with negative CT. Recent upper respiratory infections so could be related to Bell's palsy -Neurology has seen in consultation and recommended MRI of the brain followed by LP especially with associated headache-patient deferred MRI as well as LP stating he needed to leave to attend to his dog that got out of the backyard stating he is the only one the dog will listen to. Reported to nursing staff he would leave and come back to the ED. He was not deemed appropriate for discharge to home. The RN informed the patient at this and he insisted on leaving AMA. -Toradol 30 mg was given as needed for pain and headache -Decadron 10 mg IV 1 for headache -no narcotics were given -Environmental treatments consisting of room darkening and decreased stimulation were utilized  Active Problems:   Acute hyperglycemia -Reported history of diabetes but currently no medications -Hemoglobin A1c obtained and pending prior to patient leaving AMA    Chronic back pain -MRI performed in 2013 revealed lumbar changes of degeneration but without HNP -Avoided narcotics -Tylenol and Toradol were utilized for pain    Polysubstance abuse -Urine drug screen positive for opiates (did not receive an ER and based on medication reconciliation patient states is not  taking prescribed narcotics), cocaine and marijuana    HTN (hypertension) -Initial blood pressure at presentation was 198/109 with tachycardia -BP improved after given Ativan -Hypertension could also explain patient's presentation symptoms of headache and visual changes -Consideration was given to drug  withdrawal in the differential -prn hydralazine -Likely has long-standing untreated hypertension given EKG changes concerning for LVH; did not have heart failure symptoms    History of seizures -Not on antiepileptic drugs prior to admission -Suspect prior seizures related to substance abuse withdrawal  Procedures:  LP ordered but patient did not complete  Tom Conley ordered but patient did not complete  Consultations:  Neurology  Discharge Exam: Vitals:   06/25/16 0600 06/25/16 0743  BP: 135/93 138/94  Pulse: 72 68  Resp: 18 14  Temp:  98.4 F (36.9 C)    Patient examined earlier today. Patient was not reexamined prior to leaving [facility]  Discharge Instructions  **No formal discharge instructions given since patient left AMA  Discharge Medication List as of 06/25/2016  3:30 PM    CONTINUE these medications which have NOT CHANGED   Details  azithromycin (ZITHROMAX Z-PAK) 250 MG tablet Use as directed, Print    cyclobenzaprine (FLEXERIL) 10 MG tablet Take 1 tablet (10 mg total) by mouth 3 (three) times daily as needed for muscle spasms., Starting Mon 05/28/2014, Print    diazepam (VALIUM) 5 MG tablet Take 1 tablet (5 mg total) by mouth every 6 (six) hours as needed for muscle spasms., Starting Thu 07/27/2013, Print    diclofenac (CATAFLAM) 50 MG tablet Take 1 tablet (50 mg total) by mouth 3 (three) times daily., Starting Thu 08/10/2013, Print    ibuprofen (ADVIL,MOTRIN) 800 MG tablet Take 1 tablet (800 mg total) by mouth every 6 (six) hours as needed for mild pain or moderate pain., Starting Thu 07/27/2013, Print    ketorolac (TORADOL) 10 MG tablet Take 1 tablet (10 mg total) by mouth every 6 (six) hours as needed., Starting Fri 01/24/2016, Print    !! oxyCODONE-acetaminophen (PERCOCET) 5-325 MG per tablet 1 to 2 tablets every 6 hours as needed for pain., Print    !! oxyCODONE-acetaminophen (PERCOCET/ROXICET) 5-325 MG per tablet Take 1-2 tablets by mouth every 6 (six) hours as  needed for severe pain., Starting Thu 07/27/2013, Print    !! oxyCODONE-acetaminophen (ROXICET) 5-325 MG per tablet Take 1 tablet by mouth every 4 (four) hours as needed for severe pain., Starting Tue 08/08/2013, Print    polyethylene glycol powder (GLYCOLAX/MIRALAX) powder Take 17 g by mouth 2 (two) times daily. Until daily soft stools  OTC, Starting Thu 07/27/2013, Print    PredniSONE 10 MG KIT 12 day dose pack taper PO, Print     !! - Potential duplicate medications found. Please discuss with provider.     Allergies  Allergen Reactions  . Penicillins Hives      The results of significant diagnostics from this hospitalization (including imaging, microbiology, ancillary and laboratory) are listed below for reference.    Significant Diagnostic Studies: Dg Chest 2 View  Result Date: 06/25/2016 CLINICAL DATA:  Acute onset of right-sided headache and right-sided visual changes. Productive cough. Initial encounter. EXAM: CHEST  2 VIEW COMPARISON:  Chest radiograph performed 08/10/2013 FINDINGS: The lungs are well-aerated. Mild peribronchial thickening is noted. There is no evidence of focal opacification, pleural effusion or pneumothorax. The heart is normal in size; the mediastinal contour is within normal limits. No acute osseous abnormalities are seen. IMPRESSION: Mild peribronchial thickening noted.  Lungs remain grossly  clear. Electronically Signed   By: Garald Balding M.D.   On: 06/25/2016 00:38   Ct Head Wo Contrast  Result Date: 06/25/2016 CLINICAL DATA:  Acute onset of right-sided headache. Right-sided visual changes. Productive cough. Initial encounter. EXAM: CT HEAD WITHOUT CONTRAST TECHNIQUE: Contiguous axial images were obtained from the base of the skull through the vertex without intravenous contrast. COMPARISON:  CT of the head performed 07/25/2013 FINDINGS: Brain: No evidence of acute infarction, hemorrhage, hydrocephalus, extra-axial collection or mass lesion/mass effect.  The posterior fossa, including the cerebellum, brainstem and fourth ventricle, is within normal limits. The third and lateral ventricles, and basal ganglia are unremarkable in appearance. The cerebral hemispheres are symmetric in appearance, with normal gray-white differentiation. No mass effect or midline shift is seen. Vascular: No hyperdense vessel or unexpected calcification. Skull: There is no evidence of fracture; visualized osseous structures are unremarkable in appearance. Sinuses/Orbits: The orbits are within normal limits. The paranasal sinuses and mastoid air cells are well-aerated. Other: No significant soft tissue abnormalities are seen. IMPRESSION: Unremarkable noncontrast CT of the head. Electronically Signed   By: Garald Balding M.D.   On: 06/25/2016 00:37    Microbiology: No results found for this or any previous visit (from the past 240 hour(s)).   Labs: Basic Metabolic Panel:  Recent Labs Lab 06/24/16 2034 06/24/16 2042  NA 141 143  K 4.2 4.1  CL 107 105  CO2 28  --   GLUCOSE 133* 129*  BUN 11 14  CREATININE 1.02 1.00  CALCIUM 9.4  --    Liver Function Tests:  Recent Labs Lab 06/24/16 2034  AST 17  ALT 15*  ALKPHOS 94  BILITOT 0.4  PROT 6.7  ALBUMIN 3.4*   No results for input(s): LIPASE, AMYLASE in the last 168 hours. No results for input(s): AMMONIA in the last 168 hours. CBC:  Recent Labs Lab 06/24/16 2034 06/24/16 2042  WBC 6.9  --   NEUTROABS 3.2  --   HGB 13.2 13.3  HCT 39.8 39.0  MCV 100.3*  --   PLT 276  --    Cardiac Enzymes: No results for input(s): CKTOTAL, CKMB, CKMBINDEX, TROPONINI in the last 168 hours. BNP: BNP (last 3 results) No results for input(s): BNP in the last 8760 hours.  ProBNP (last 3 results) No results for input(s): PROBNP in the last 8760 hours.  CBG: No results for input(s): GLUCAP in the last 168 hours.     Signed:  ELLIS,ALLISON L. ANP  [facility] 06/25/2016, 4:25 PM

## 2016-06-25 NOTE — ED Notes (Signed)
Spoke with patient the need to stay and patient stated he needs to leave and will come back to the hospital today after finding his dog.

## 2016-06-25 NOTE — ED Notes (Signed)
Spoke with Administrator, artssecretary Doctor did not call back will page again.

## 2016-06-26 LAB — HEMOGLOBIN A1C
HEMOGLOBIN A1C: 5.2 % (ref 4.8–5.6)
Mean Plasma Glucose: 103 mg/dL

## 2017-08-24 ENCOUNTER — Emergency Department (HOSPITAL_COMMUNITY): Payer: Self-pay

## 2017-08-24 ENCOUNTER — Emergency Department (HOSPITAL_COMMUNITY)
Admission: EM | Admit: 2017-08-24 | Discharge: 2017-08-24 | Disposition: A | Discharge status: Home / Self Care | Payer: Self-pay | Attending: Emergency Medicine | Admitting: Emergency Medicine

## 2017-08-24 DIAGNOSIS — R519 Headache, unspecified: Secondary | ICD-10-CM

## 2017-08-24 DIAGNOSIS — R531 Weakness: Secondary | ICD-10-CM | POA: Insufficient documentation

## 2017-08-24 DIAGNOSIS — Z8673 Personal history of transient ischemic attack (TIA), and cerebral infarction without residual deficits: Secondary | ICD-10-CM | POA: Insufficient documentation

## 2017-08-24 DIAGNOSIS — R202 Paresthesia of skin: Secondary | ICD-10-CM

## 2017-08-24 DIAGNOSIS — I10 Essential (primary) hypertension: Secondary | ICD-10-CM | POA: Insufficient documentation

## 2017-08-24 DIAGNOSIS — F1490 Cocaine use, unspecified, uncomplicated: Secondary | ICD-10-CM | POA: Insufficient documentation

## 2017-08-24 DIAGNOSIS — F1290 Cannabis use, unspecified, uncomplicated: Secondary | ICD-10-CM | POA: Insufficient documentation

## 2017-08-24 DIAGNOSIS — Z8639 Personal history of other endocrine, nutritional and metabolic disease: Secondary | ICD-10-CM | POA: Insufficient documentation

## 2017-08-24 DIAGNOSIS — R51 Headache: Secondary | ICD-10-CM

## 2017-08-24 DIAGNOSIS — F17210 Nicotine dependence, cigarettes, uncomplicated: Secondary | ICD-10-CM | POA: Insufficient documentation

## 2017-08-24 DIAGNOSIS — E119 Type 2 diabetes mellitus without complications: Secondary | ICD-10-CM | POA: Insufficient documentation

## 2017-08-24 LAB — I-STAT CHEM 8, ED
BUN: 11 mg/dL (ref 6–20)
CREATININE: 1 mg/dL (ref 0.61–1.24)
Calcium, Ion: 1.13 mmol/L — ABNORMAL LOW (ref 1.15–1.40)
Chloride: 106 mmol/L (ref 101–111)
Glucose, Bld: 92 mg/dL (ref 65–99)
HEMATOCRIT: 41 % (ref 39.0–52.0)
Hemoglobin: 13.9 g/dL (ref 13.0–17.0)
POTASSIUM: 4.1 mmol/L (ref 3.5–5.1)
Sodium: 143 mmol/L (ref 135–145)
TCO2: 26 mmol/L (ref 22–32)

## 2017-08-24 LAB — CBC
HEMATOCRIT: 40.4 % (ref 39.0–52.0)
HEMOGLOBIN: 13.5 g/dL (ref 13.0–17.0)
MCH: 33.7 pg (ref 26.0–34.0)
MCHC: 33.4 g/dL (ref 30.0–36.0)
MCV: 100.7 fL — AB (ref 78.0–100.0)
Platelets: 243 10*3/uL (ref 150–400)
RBC: 4.01 MIL/uL — ABNORMAL LOW (ref 4.22–5.81)
RDW: 12.3 % (ref 11.5–15.5)
WBC: 6.2 10*3/uL (ref 4.0–10.5)

## 2017-08-24 LAB — DIFFERENTIAL
BASOS PCT: 1 %
Basophils Absolute: 0.1 10*3/uL (ref 0.0–0.1)
Eosinophils Absolute: 0.2 10*3/uL (ref 0.0–0.7)
Eosinophils Relative: 3 %
LYMPHS ABS: 1.8 10*3/uL (ref 0.7–4.0)
Lymphocytes Relative: 30 %
MONO ABS: 0.4 10*3/uL (ref 0.1–1.0)
MONOS PCT: 6 %
NEUTROS ABS: 3.7 10*3/uL (ref 1.7–7.7)
Neutrophils Relative %: 60 %

## 2017-08-24 LAB — COMPREHENSIVE METABOLIC PANEL
ALK PHOS: 107 U/L (ref 38–126)
ALT: 21 U/L (ref 17–63)
AST: 23 U/L (ref 15–41)
Albumin: 3.7 g/dL (ref 3.5–5.0)
Anion gap: 10 (ref 5–15)
BILIRUBIN TOTAL: 0.6 mg/dL (ref 0.3–1.2)
BUN: 11 mg/dL (ref 6–20)
CALCIUM: 8.9 mg/dL (ref 8.9–10.3)
CHLORIDE: 107 mmol/L (ref 101–111)
CO2: 22 mmol/L (ref 22–32)
CREATININE: 0.97 mg/dL (ref 0.61–1.24)
Glucose, Bld: 92 mg/dL (ref 65–99)
Potassium: 4 mmol/L (ref 3.5–5.1)
Sodium: 139 mmol/L (ref 135–145)
TOTAL PROTEIN: 6.7 g/dL (ref 6.5–8.1)

## 2017-08-24 LAB — I-STAT TROPONIN, ED: TROPONIN I, POC: 0 ng/mL (ref 0.00–0.08)

## 2017-08-24 LAB — PROTIME-INR
INR: 0.94
Prothrombin Time: 12.5 seconds (ref 11.4–15.2)

## 2017-08-24 LAB — CBG MONITORING, ED: Glucose-Capillary: 90 mg/dL (ref 65–99)

## 2017-08-24 LAB — APTT: APTT: 28 s (ref 24–36)

## 2017-08-24 LAB — ETHANOL

## 2017-08-24 MED ORDER — KETOROLAC TROMETHAMINE 30 MG/ML IJ SOLN
30.0000 mg | Freq: Once | INTRAMUSCULAR | Status: AC
  Administered 2017-08-24: 30 mg via INTRAVENOUS
  Filled 2017-08-24: qty 1

## 2017-08-24 MED ORDER — DIPHENHYDRAMINE HCL 50 MG/ML IJ SOLN
25.0000 mg | Freq: Once | INTRAMUSCULAR | Status: AC
  Administered 2017-08-24: 25 mg via INTRAVENOUS
  Filled 2017-08-24: qty 1

## 2017-08-24 MED ORDER — LORAZEPAM 2 MG/ML IJ SOLN
1.0000 mg | Freq: Once | INTRAMUSCULAR | Status: AC
  Administered 2017-08-24: 1 mg via INTRAVENOUS
  Filled 2017-08-24: qty 1

## 2017-08-24 MED ORDER — METOCLOPRAMIDE HCL 5 MG/ML IJ SOLN
10.0000 mg | Freq: Once | INTRAMUSCULAR | Status: AC
  Administered 2017-08-24: 10 mg via INTRAVENOUS
  Filled 2017-08-24: qty 2

## 2017-08-24 MED ORDER — SODIUM CHLORIDE 0.9 % IV BOLUS (SEPSIS)
1000.0000 mL | Freq: Once | INTRAVENOUS | Status: AC
  Administered 2017-08-24: 1000 mL via INTRAVENOUS

## 2017-08-24 NOTE — ED Notes (Signed)
Patient returned from MRI.

## 2017-08-24 NOTE — ED Provider Notes (Signed)
[facility] Provider Note   CSN: [id number] Arrival date & time: 08/24/17  0154     History   Chief Complaint Chief Complaint  Patient presents with  . Code Stroke    HPI Tom Conley is a 52 y.o. male.   The history is provided by the patient.  He has history of hypertension, diabetes, chronic back pain, polysubstance abuse, seizures and was brought in as a code stroke.  He states that there is numbness of the right arm and right side of the face.  However, he is a very unreliable historian and important aspects of the history change during the course of interviewing the patient.  He has a history of Bell's palsy on the left side which had resolved.  He has a history of vision problems over the last several months.  He has weakness of his right arm which apparently is several months old.  He is now complaining of a bifrontal headache.  There is no nausea or vomiting.  There is no photophobia or phonophobia.  He has finally settled on approximately 24 hours that his symptoms have been present.  He does admit to cocaine use and marijuana use today.  He denies alcohol use.  Past Medical History:  Diagnosis Date  . Back pain   . Diabetes mellitus without complication ([place])    hypoglycemic  . Herniated disc   . Hypertension   . Seizures [facility])     Patient Active Problem List   Diagnosis Date Noted  . Vision changes 06/25/2016  . Headache 06/25/2016  . Acute hyperglycemia 06/25/2016  . Chronic back pain 06/25/2016  . Polysubstance abuse ([place]) 06/25/2016  . History of seizures 06/25/2016  . HTN (hypertension) 06/25/2016  . Right-sided headache   . Essential hypertension     Past Surgical History:  Procedure Laterality Date  . fracture arm Left 1975   ? plate on ulna       Home Medications    Prior to Admission medications   Medication Sig Start Date End Date Taking? Authorizing Provider  azithromycin (ZITHROMAX Z-PAK)  250 MG tablet Use as directed Patient not taking: Reported on 06/25/2016 08/08/13   Liam Graham, PA-C  cyclobenzaprine (FLEXERIL) 10 MG tablet Take 1 tablet (10 mg total) by mouth 3 (three) times daily as needed for muscle spasms. Patient not taking: Reported on 06/25/2016 05/28/14   Olin Hauser, DO  diazepam (VALIUM) 5 MG tablet Take 1 tablet (5 mg total) by mouth every 6 (six) hours as needed for muscle spasms. Patient not taking: Reported on 06/25/2016 07/27/13   Piepenbrink, Anderson Malta, PA-C  diclofenac (CATAFLAM) 50 MG tablet Take 1 tablet (50 mg total) by mouth 3 (three) times daily. Patient not taking: Reported on 06/25/2016 08/10/13   Liam Graham, PA-C  ibuprofen (ADVIL,MOTRIN) 800 MG tablet Take 1 tablet (800 mg total) by mouth every 6 (six) hours as needed for mild pain or moderate pain. Patient not taking: Reported on 06/25/2016 07/27/13   Piepenbrink, Anderson Malta, PA-C  ketorolac (TORADOL) 10 MG tablet Take 1 tablet (10 mg total) by mouth every 6 (six) hours as needed. Patient not taking: Reported on 06/25/2016 01/24/16   Comer Locket, PA-C  oxyCODONE-acetaminophen (PERCOCET) 5-325 MG per tablet 1 to 2 tablets every 6 hours as needed for pain. Patient not taking: Reported on 06/25/2016 08/04/13   Harden Mo, MD  oxyCODONE-acetaminophen (PERCOCET/ROXICET) 5-325 MG per tablet Take 1-2 tablets by mouth every 6 (  six) hours as needed for severe pain. Patient not taking: Reported on 06/25/2016 07/27/13   Antonietta Breach, PA-C  oxyCODONE-acetaminophen (ROXICET) 5-325 MG per tablet Take 1 tablet by mouth every 4 (four) hours as needed for severe pain. Patient not taking: Reported on 06/25/2016 08/08/13   Allena Katz H, PA-C  polyethylene glycol powder (GLYCOLAX/MIRALAX) powder Take 17 g by mouth 2 (two) times daily. Until daily soft stools  OTC Patient not taking: Reported on 06/25/2016 07/27/13   Baron Sane, PA-C  PredniSONE 10 MG KIT 12 day dose pack  taper PO Patient not taking: Reported on 06/25/2016 05/28/14   Olin Hauser, DO    Family History Family History  Problem Relation Age of Onset  . Diabetes Mother     Social History Social History   Tobacco Use  . Smoking status: Current Some Day Smoker    Packs/day: 0.25    Types: Cigarettes  . Smokeless tobacco: Never Used  Substance Use Topics  . Alcohol use: Yes  . Drug use: No     Allergies   Penicillins   Review of Systems Review of Systems  All other systems reviewed and are negative.    Physical Exam Updated Vital Signs BP 134/77   Pulse 72   Resp 17   Ht 5' 9" (1.753 m)   Wt 78.8 kg (173 lb 11.6 oz)   SpO2 95%   BMI 25.65 kg/m   Physical Exam  Nursing note and vitals reviewed.  52 year old male, resting comfortably and in no acute distress. Vital signs are normal. Oxygen saturation is 95%, which is normal. Head is normocephalic and atraumatic. PERRLA, EOMI. Oropharynx is clear.  There is mild right periorbital swelling without tenderness or erythema. Neck is nontender and supple without adenopathy or JVD. Back is nontender and there is no CVA tenderness. Lungs are clear without rales, wheezes, or rhonchi. Chest is nontender. Heart has regular rate and rhythm without murmur. Abdomen is soft, flat, nontender without masses or hepatosplenomegaly and peristalsis is normoactive. Extremities have no cyanosis or edema, full range of motion is present. Skin is warm and dry without rash. Neurologic: He is awake and alert and oriented, speech is mildly dysarthric, right facial droop is noted, there is minimal weakness of the right arm compared with the left (he is right-handed).  ED Treatments / Results  Labs (all labs ordered are listed, but only abnormal results are displayed) Labs Reviewed  CBC - Abnormal; Notable for the following components:      Result Value   RBC 4.01 (*)    MCV 100.7 (*)    All other components within normal  limits  I-STAT CHEM 8, ED - Abnormal; Notable for the following components:   Calcium, Ion 1.13 (*)    All other components within normal limits  ETHANOL  PROTIME-INR  APTT  DIFFERENTIAL  COMPREHENSIVE METABOLIC PANEL  RAPID URINE DRUG SCREEN, HOSP PERFORMED  URINALYSIS, ROUTINE W REFLEX MICROSCOPIC  I-STAT TROPONIN, ED  CBG MONITORING, ED    EKG  EKG Interpretation  Date/Time:  Tuesday August 24 2017 02:15:17 EST Ventricular Rate:  84 PR Interval:    QRS Duration: 95 QT Interval:  378 QTC Calculation: 447 R Axis:   88 Text Interpretation:  Sinus rhythm Probable left ventricular hypertrophy ST elev, probable normal early repol pattern When compared with ECG of 06/25/2016, No significant change was found Confirmed by Delora Fuel ([id number]) on 08/24/2017 2:18:23 AM       Radiology  Mr Brain Wo Contrast  Result Date: 08/24/2017 CLINICAL DATA:  Focal neuro deficit greater than 6 hours. Suspect stroke. Blurred vision. Right hand numbness EXAM: MRI HEAD WITHOUT CONTRAST TECHNIQUE: Multiplanar, multiecho pulse sequences of the brain and surrounding structures were obtained without intravenous contrast. COMPARISON:  CT head 08/24/2017 FINDINGS: Brain: No acute infarction, hemorrhage, hydrocephalus, extra-axial collection or mass lesion. Vascular: Normal arterial flow voids Skull and upper cervical spine: Negative Sinuses/Orbits: Mild mucosal edema paranasal sinuses.  Normal orbit. Other: None IMPRESSION: Negative MRI head Electronically Signed   By: Franchot Gallo M.D.   On: 08/24/2017 06:39   Ct Head Code Stroke Wo Contrast  Result Date: 08/24/2017 CLINICAL DATA:  Code stroke. RIGHT-sided numbness, blurry vision, dizziness, RIGHT facial twitch and headache today. History of diabetes, hypertension and seizures. EXAM: CT HEAD WITHOUT CONTRAST TECHNIQUE: Contiguous axial images were obtained from the base of the skull through the vertex without intravenous contrast. COMPARISON:  CT HEAD  June 25, 2016 FINDINGS: BRAIN: No intraparenchymal hemorrhage, mass effect nor midline shift. The ventricles and sulci are normal. No acute large vascular territory infarcts. No abnormal extra-axial fluid collections. Basal cisterns are patent. VASCULAR: Unremarkable. SKULL/SOFT TISSUES: No skull fracture. No significant soft tissue swelling. Scalp scarring. ORBITS/SINUSES: The included ocular globes and orbital contents are normal.The mastoid aircells and included paranasal sinuses are well-aerated. OTHER: None. ASPECTS [facility] Stroke Program Early CT Score) - Ganglionic level infarction (caudate, lentiform nuclei, internal capsule, insula, M1-M3 cortex): 7 - Supraganglionic infarction (M4-M6 cortex): 3 Total score (0-10 with 10 being normal): 10 IMPRESSION: 1. Normal noncontrast CT HEAD. 2. ASPECTS is 10. Acute findings text paged to Dr.AROOR, Neurology via AMION secure system on 08/24/2017 at 2:10 am. Electronically Signed   By: Elon Alas M.D.   On: 08/24/2017 02:11    Procedures Procedures  CRITICAL CARE Performed by: Delora Fuel Total critical care time: 50 minutes Critical care time was exclusive of separately billable procedures and treating other patients. Critical care was necessary to treat or prevent imminent or life-threatening deterioration. Critical care was time spent personally by me on the following activities: development of treatment plan with patient and/or surrogate as well as nursing, discussions with consultants, evaluation of patient's response to treatment, examination of patient, obtaining history from patient or surrogate, ordering and performing treatments and interventions, ordering and review of laboratory studies, ordering and review of radiographic studies, pulse oximetry and re-evaluation of patient's condition.  Medications Ordered in ED Medications  sodium chloride 0.9 % bolus 1,000 mL (not administered)  metoCLOPramide (REGLAN) injection 10 mg (not  administered)  diphenhydrAMINE (BENADRYL) injection 25 mg (not administered)  ketorolac (TORADOL) 30 MG/ML injection 30 mg (not administered)  LORazepam (ATIVAN) injection 1 mg (not administered)     Initial Impression / Assessment and Plan / ED Course  I have reviewed the triage vital signs and the nursing notes.  Pertinent labs & imaging results that were available during my care of the patient were reviewed by me and considered in my medical decision making (see chart for details).  Questionable neurologic symptoms in patient with variable history.  Old records are reviewed, and he had been admitted as a code stroke approximately 1 year ago and left AMA prior to obtaining MRI scan.  At this point, I question whether some of his symptoms may actually be related to his headache.  CT is unremarkable.  Will send for MRI and will give headache cocktail.  He had excellent relief of his headache with above-noted  treatment.  MRI shows no evidence of stroke or tumor.  He is discharged.  Final Clinical Impressions(s) / ED Diagnoses   Final diagnoses:  Bad headache  Paresthesia    ED Discharge Orders    None      Delora Fuel, MD 40/98/11 [phone]

## 2017-08-24 NOTE — ED Notes (Signed)
Patient left department in custody of police with clothing in his possession. Police maintained custody of all other patient property.

## 2017-08-24 NOTE — Consult Note (Addendum)
Requesting Physician: Dr. Roxanne Mins    Chief Complaint: Stroke  History obtained from: Patient and Chart   HPI:                                                                                                                                       Tom [facility] is an 52 y.o. male past medical history of? Stroke, diabetes, hypertension, seizures, substance abuse including cocaine, marijuana and opiates presents to the emergency room as a stroke alert for right-sided numbness, headache and right face drawing up.  Last known normal is unclear- patient states that it happened about 24 hours ago. He was picked up by police tonight and complained of the right face and hand numbness while he was in jail which is why they called EMS. He is brought as a stroke alert, since he first complained of the symptoms only 1 hour prior to arrival. He also complained of blurry vision from his right eye which has been going on for 4 months. He also states that he has slurred speech and facial droop at that time but never saw a doctor. His blood pressure was 272 systolic.  He presented with somewhat similar complaints in 2017 with right-sided headache, sixth nerve palsy however left AMA before obtaining an MRI. UDS was positive for cocaine and opiates at the time. He states that his symptoms resolved completely at that time.    Date last known well: Unclear Time last known well: Unclear tPA Given: No outside window NIHSS: 4 Baseline MRS 0   Past Medical History:  Diagnosis Date  . Back pain   . Diabetes mellitus without complication ([place])    hypoglycemic  . Herniated disc   . Hypertension   . Seizures ([place])     Past Surgical History:  Procedure Laterality Date  . fracture arm Left 1975   ? plate on ulna    Family History  Problem Relation Age of Onset  . Diabetes Mother    Social History:  reports that he has been smoking cigarettes.  He has been smoking about 0.25 packs per day. he has never  used smokeless tobacco. He reports that he drinks alcohol. He reports that he does not use drugs.  Allergies:  Allergies  Allergen Reactions  . Penicillins Hives    Medications:  He does not take any medications    ROS:                                                                                                                                     14 systems reviewed and negative except above .    Examination:                                                                                                      General: Appears well-developed and well-nourished.  Psych: Affect appropriate to situation Eyes: No scleral injection HENT: No OP obstrucion Head: Normocephalic.  Cardiovascular: Normal rate and regular rhythm.  Respiratory: Effort normal and breath sounds normal to anterior ascultation GI: Soft.  No distension. There is no tenderness.  Skin: WDI   Neurological Examination Mental Status: Alert, oriented, thought content appropriate.  Speech fluent without evidence of aphasia. Able to follow 3 step commands without difficulty. Cranial Nerves: Fundus: no evidence of papilledema II: Visual fields normal in left eye, Inferior nasal quadrant appears to be affected in right eye III,IV, VI: ptosis not present, extra-ocular motions intact bilaterally, pupils equal, round, reactive to light and accommodation V,VII:  facial light touch sensation reduced on right side, no obvious facial droop on right - however patient tries to move only left side of mouth VIII: hearing normal bilaterally IX,X: uvula rises symmetrically XI: bilateral shoulder shrug XII: midline tongue extension Motor: Right : Upper extremity   4+/5    Left:     Upper extremity   5/5  Lower extremity   4/5     Lower extremity   5/5 Tone and bulk:normal tone throughout; no atrophy  noted Sensory: reduced sensation over R side, particularly over right hand and forearm ( subjective sensation) Deep Tendon Reflexes: 2+ and symmetric throughout Plantars: Right: downgoing   Left: downgoing Cerebellar: normal finger-to-nose, normal rapid alternating movements and normal heel-to-shin test Gait: normal gait and station     Lab Results: Basic Metabolic Panel: Recent Labs  Lab 08/24/17 0156 08/24/17 0201  NA 139 143  K 4.0 4.1  CL 107 106  CO2 22  --   GLUCOSE 92 92  BUN 11 11  CREATININE 0.97 1.00  CALCIUM 8.9  --     CBC: Recent Labs  Lab 08/24/17 0156 08/24/17 0201  WBC 6.2  --   NEUTROABS 3.7  --   HGB 13.5 13.9  HCT 40.4 41.0  MCV 100.7*  --   PLT 243  --  Coagulation Studies: Recent Labs    08/24/17 0156  LABPROT 12.5  INR 0.94    Imaging: Ct Head Code Stroke Wo Contrast  Result Date: 08/24/2017 CLINICAL DATA:  Code stroke. RIGHT-sided numbness, blurry vision, dizziness, RIGHT facial twitch and headache today. History of diabetes, hypertension and seizures. EXAM: CT HEAD WITHOUT CONTRAST TECHNIQUE: Contiguous axial images were obtained from the base of the skull through the vertex without intravenous contrast. COMPARISON:  CT HEAD June 25, 2016 FINDINGS: BRAIN: No intraparenchymal hemorrhage, mass effect nor midline shift. The ventricles and sulci are normal. No acute large vascular territory infarcts. No abnormal extra-axial fluid collections. Basal cisterns are patent. VASCULAR: Unremarkable. SKULL/SOFT TISSUES: No skull fracture. No significant soft tissue swelling. Scalp scarring. ORBITS/SINUSES: The included ocular globes and orbital contents are normal.The mastoid aircells and included paranasal sinuses are well-aerated. OTHER: None. ASPECTS [facility] Stroke Program Early CT Score) - Ganglionic level infarction (caudate, lentiform nuclei, internal capsule, insula, M1-M3 cortex): 7 - Supraganglionic infarction (M4-M6 cortex): 3 Total  score (0-10 with 10 being normal): 10 IMPRESSION: 1. Normal noncontrast CT HEAD. 2. ASPECTS is 10. Acute findings text paged to Dr.AROOR, Neurology via AMION secure system on 08/24/2017 at 2:10 am. Electronically Signed   By: Elon Alas M.D.   On: 08/24/2017 02:11     ASSESSMENT AND PLAN  52 y.o. male past medical history of? Stroke, diabetes, hypertension, seizures, substance abuse including cocaine, marijuana and opiates presents to the emergency room as a stroke alert for right-sided numbness, headache and right face drawing up. Unclear timeline of his symptoms- states his vision has been affected since 4 months, headache and numbness since today. He can't state exactly when his right leg has been weak. Also his examination is inconsistent for all this to be from a stroke - He attempts to smile only from the left side of his mouth. He does have preseptal edema around his left eye. Patient admit to using cocaine today.   Stroke vs Conversion disorder vs Complicated migraine   MRI brain negative for acute infarcts,demyelinating lesions. Suspect symptoms 2/2 headache due to HTN and cocaine abuse, If Symptoms not better obtain CTA head to look for  [facility] consult for left eye vision blurriness, preseptal edema. This appears to be a chronic problem and likely unrelated to current symptoms.  Please check B12, RPR, TSH, ESR CRP as well/   F/U with Neurology as outpatient.   Sushanth Aroor [facility] Pager Number [phone]

## 2017-08-24 NOTE — ED Notes (Signed)
Patient transported to MRI 

## 2017-08-24 NOTE — ED Triage Notes (Signed)
Physician arrives via EMS with complaint of stroke like symptoms. Initial stated LKW was 0000 today. After speaking with patient he reports that symptoms have been ongoing since yesterday. Right hand is numb, right face is "drawing up" and numb, and his vision is blurry. After further discussion patient reports vision issues have been ongoing for 4 months. He then reports that right arm issues have been ongoing for a "while". Reports history of stroke 1.5 years ago. Some edema noted to right eye. Patient states persistent drainage from right eye for months.

## 2019-07-20 ENCOUNTER — Other Ambulatory Visit: Payer: Self-pay

## 2019-07-20 ENCOUNTER — Encounter (HOSPITAL_COMMUNITY): Payer: Self-pay

## 2019-07-20 ENCOUNTER — Ambulatory Visit (HOSPITAL_COMMUNITY)
Admission: EM | Admit: 2019-07-20 | Discharge: 2019-07-20 | Disposition: A | Discharge status: Home / Self Care | Payer: Self-pay | Attending: Family Medicine | Admitting: Family Medicine

## 2019-07-20 DIAGNOSIS — K0889 Other specified disorders of teeth and supporting structures: Secondary | ICD-10-CM

## 2019-07-20 MED ORDER — CLINDAMYCIN HCL 300 MG PO CAPS: 300.0000 mg | ORAL_CAPSULE | Freq: Three times a day (TID) | ORAL | 0 refills | Status: DC

## 2019-07-20 MED ORDER — HYDROCODONE-ACETAMINOPHEN 5-325 MG PO TABS: 1.0000 | ORAL_TABLET | Freq: Four times a day (QID) | ORAL | 0 refills | Status: DC | PRN

## 2019-07-20 NOTE — ED Triage Notes (Signed)
Patient presents to Urgent Care with complaints of abscess on the inside of his mouth on the right side since about 4 days ago. Patient reports no difficulty swallowing, does not think it is open or draining.

## 2019-07-20 NOTE — Discharge Instructions (Signed)
Be aware, pain medications may cause drowsiness. Please do not drive, operate heavy machinery or make important decisions while on this medication, it can cloud your judgement.  

## 2019-07-25 NOTE — ED Provider Notes (Signed)
[place]   [id number] 07/20/19 Arrival Time: 1938  ASSESSMENT & PLAN:  1. Pain, dental     No sign of abscess requiring I&D at this time. Discussed.  Meds ordered this encounter  Medications  . clindamycin (CLEOCIN) 300 MG capsule    Sig: Take 1 capsule (300 mg total) by mouth 3 (three) times daily.    Dispense:  30 capsule    Refill:  0  . HYDROcodone-acetaminophen (NORCO/VICODIN) 5-325 MG tablet    Sig: Take 1 tablet by mouth every 6 (six) hours as needed for moderate pain or severe pain.    Dispense:  8 tablet    Refill:  0    [place] Controlled Substances Registry consulted for this patient. I feel the risk/benefit ratio today is favorable for proceeding with this prescription for a controlled substance. Medication sedation precautions given.  Dental resource written instructions given. He will schedule dental evaluation as soon as possible if not improving over the next 24-48 hours.  Reviewed expectations re: course of current medical issues. Questions answered. Outlined signs and symptoms indicating need for more acute intervention. Patient verbalized understanding. After Visit Summary given.   SUBJECTIVE:  Tom Conley is a 53 y.o. male who reports gradual onset of right upper dental pain described as aching/throbbing. Present for 3-4 days. Fever: absent. Tolerating PO intake but reports pain with chewing. Normal swallowing. He does not see a dentist regularly. No neck swelling or pain. OTC analgesics without relief.  ROS: As per HPI.   OBJECTIVE: Vitals:   07/20/19 1948  BP: (!) 159/98  Pulse: 90  Resp: 16  Temp: 98.7 F (37.1 C)  TempSrc: Oral  SpO2: 100%    General appearance: alert; no distress HENT: normocephalic; atraumatic; dentition: fair; right upper gums without areas of fluctuance, drainage, or bleeding and with tenderness to palpation; normal jaw movement without difficulty Neck: supple without LAD; FROM; trachea midline Lungs:  normal respirations; unlabored Skin: warm and dry Psychological: alert and cooperative; normal mood and affect  Allergies  Allergen Reactions  . Penicillins Hives    Past Medical History:  Diagnosis Date  . Back pain   . Diabetes mellitus without complication ([place])    hypoglycemic  . Herniated disc   . Hypertension   . Seizures ([place])    Social History   Socioeconomic History  . Marital status: Single    Spouse name: Not on file  . Number of children: Not on file  . Years of education: Not on file  . Highest education level: Not on file  Occupational History  . Not on file  Tobacco Use  . Smoking status: Current Some Day Smoker    Packs/day: 0.50    Types: Cigarettes  . Smokeless tobacco: Never Used  Substance and Sexual Activity  . Alcohol use: Yes  . Drug use: No  . Sexual activity: Yes    Birth control/protection: Condom  Other Topics Concern  . Not on file  Social History Narrative  . Not on file   Social Determinants of Health   Financial Resource Strain:   . Difficulty of Paying Living Expenses: Not on file  Food Insecurity:   . Worried About Charity fundraiser in the Last Year: Not on file  . Ran Out of Food in the Last Year: Not on file  Transportation Needs:   . Lack of Transportation (Medical): Not on file  . Lack of Transportation (Non-Medical): Not on file  Physical Activity:   .  Days of Exercise per Week: Not on file  . Minutes of Exercise per Session: Not on file  Stress:   . Feeling of Stress : Not on file  Social Connections:   . Frequency of Communication with Friends and Family: Not on file  . Frequency of Social Gatherings with Friends and Family: Not on file  . Attends Religious Services: Not on file  . Active Member of Clubs or Organizations: Not on file  . Attends Banker Meetings: Not on file  . Marital Status: Not on file  Intimate Partner Violence:   . Fear of Current or Ex-Partner: Not on file  . Emotionally  Abused: Not on file  . Physically Abused: Not on file  . Sexually Abused: Not on file   Family History  Problem Relation Age of Onset  . Diabetes Mother    Past Surgical History:  Procedure Laterality Date  . fracture arm Left 1975   ? plate on ulna     Mardella Layman, MD 07/25/19 [phone]

## 2020-09-07 ENCOUNTER — Emergency Department (HOSPITAL_COMMUNITY)
Admission: EM | Admit: 2020-09-07 | Discharge: 2020-09-08 | Disposition: A | Discharge status: Home / Self Care | Payer: Self-pay | Attending: Emergency Medicine | Admitting: Emergency Medicine

## 2020-09-07 ENCOUNTER — Encounter (HOSPITAL_COMMUNITY): Payer: Self-pay

## 2020-09-07 ENCOUNTER — Emergency Department (HOSPITAL_COMMUNITY): Payer: Self-pay

## 2020-09-07 ENCOUNTER — Other Ambulatory Visit: Payer: Self-pay

## 2020-09-07 ENCOUNTER — Encounter (HOSPITAL_COMMUNITY): Payer: Self-pay | Admitting: Emergency Medicine

## 2020-09-07 ENCOUNTER — Ambulatory Visit (HOSPITAL_COMMUNITY)
Admission: EM | Admit: 2020-09-07 | Discharge: 2020-09-07 | Disposition: A | Discharge status: Home / Self Care | Payer: Self-pay | Attending: Internal Medicine | Admitting: Internal Medicine

## 2020-09-07 DIAGNOSIS — R200 Anesthesia of skin: Secondary | ICD-10-CM | POA: Insufficient documentation

## 2020-09-07 DIAGNOSIS — M549 Dorsalgia, unspecified: Secondary | ICD-10-CM

## 2020-09-07 DIAGNOSIS — R42 Dizziness and giddiness: Secondary | ICD-10-CM | POA: Insufficient documentation

## 2020-09-07 DIAGNOSIS — I10 Essential (primary) hypertension: Secondary | ICD-10-CM | POA: Insufficient documentation

## 2020-09-07 DIAGNOSIS — R569 Unspecified convulsions: Secondary | ICD-10-CM | POA: Insufficient documentation

## 2020-09-07 DIAGNOSIS — E119 Type 2 diabetes mellitus without complications: Secondary | ICD-10-CM | POA: Insufficient documentation

## 2020-09-07 DIAGNOSIS — R110 Nausea: Secondary | ICD-10-CM | POA: Insufficient documentation

## 2020-09-07 DIAGNOSIS — F17210 Nicotine dependence, cigarettes, uncomplicated: Secondary | ICD-10-CM | POA: Insufficient documentation

## 2020-09-07 LAB — COMPREHENSIVE METABOLIC PANEL
ALT: 20 U/L (ref 0–44)
AST: 28 U/L (ref 15–41)
Albumin: 3.9 g/dL (ref 3.5–5.0)
Alkaline Phosphatase: 103 U/L (ref 38–126)
Anion gap: 12 (ref 5–15)
BUN: 15 mg/dL (ref 6–20)
CO2: 23 mmol/L (ref 22–32)
Calcium: 9.4 mg/dL (ref 8.9–10.3)
Chloride: 105 mmol/L (ref 98–111)
Creatinine, Ser: 0.84 mg/dL (ref 0.61–1.24)
GFR, Estimated: >60 mL/min (ref >60)
Glucose, Bld: 111 mg/dL — ABNORMAL HIGH (ref 70–99)
Potassium: 3.7 mmol/L (ref 3.5–5.1)
Sodium: 140 mmol/L (ref 135–145)
Total Bilirubin: 0.6 mg/dL (ref 0.3–1.2)
Total Protein: 7 g/dL (ref 6.5–8.1)

## 2020-09-07 LAB — DIFFERENTIAL
Abs Immature Granulocytes: 0.02 10*3/uL (ref 0.00–0.07)
Basophils Absolute: 0.1 10*3/uL (ref 0.0–0.1)
Basophils Relative: 1 %
Eosinophils Absolute: 0.3 10*3/uL (ref 0.0–0.5)
Eosinophils Relative: 4 %
Immature Granulocytes: 0 %
Lymphocytes Relative: 27 %
Lymphs Abs: 1.7 10*3/uL (ref 0.7–4.0)
Monocytes Absolute: 0.7 10*3/uL (ref 0.1–1.0)
Monocytes Relative: 11 %
Neutro Abs: 3.6 10*3/uL (ref 1.7–7.7)
Neutrophils Relative %: 57 %

## 2020-09-07 LAB — APTT: aPTT: 25 seconds (ref 24–36)

## 2020-09-07 LAB — CBC
HCT: 42.3 % (ref 39.0–52.0)
Hemoglobin: 14.3 g/dL (ref 13.0–17.0)
MCH: 34.5 pg — ABNORMAL HIGH (ref 26.0–34.0)
MCHC: 33.8 g/dL (ref 30.0–36.0)
MCV: 101.9 fL — ABNORMAL HIGH (ref 80.0–100.0)
Platelets: 250 10*3/uL (ref 150–400)
RBC: 4.15 MIL/uL — ABNORMAL LOW (ref 4.22–5.81)
RDW: 12.5 % (ref 11.5–15.5)
WBC: 6.3 10*3/uL (ref 4.0–10.5)
nRBC: 0 % (ref 0.0–0.2)

## 2020-09-07 LAB — CBG MONITORING, ED: Glucose-Capillary: 116 mg/dL — ABNORMAL HIGH (ref 70–99)

## 2020-09-07 LAB — PROTIME-INR
INR: 1 (ref 0.8–1.2)
Prothrombin Time: 13 seconds (ref 11.4–15.2)

## 2020-09-07 MED ORDER — SODIUM CHLORIDE 0.9 % IV BOLUS
1000.0000 mL | Freq: Once | INTRAVENOUS | Status: AC
  Administered 2020-09-07: 1000 mL via INTRAVENOUS

## 2020-09-07 MED ORDER — LORAZEPAM 2 MG/ML IJ SOLN
1.0000 mg | Freq: Once | INTRAMUSCULAR | Status: AC | PRN
  Administered 2020-09-08: 1 mg via INTRAVENOUS
  Filled 2020-09-07: qty 1

## 2020-09-07 MED ORDER — SODIUM CHLORIDE 0.9% FLUSH: 3.0000 mL | Freq: Once | INTRAVENOUS | Status: DC

## 2020-09-07 NOTE — ED Triage Notes (Signed)
Patient came in with various complaints such as dizziness and blurry vision. Pt states he feels like he is going to have a seizure and states he had a seizure about 2 years ago. Pt is also complaining of severe back pain and some difficulty with bladder control. Pt is aox4.

## 2020-09-07 NOTE — ED Notes (Signed)
Pt c/o a crampimg sensation in his lt shoulder and lt arm

## 2020-09-07 NOTE — ED Notes (Signed)
Patient is being discharged from the Urgent Care and sent to the Emergency Department via wheelchair. Per provider hagler, patient is in need of higher level of care due to necessity of treatments and presentation. Patient is aware and verbalizes understanding of plan of care.  Vitals:   09/07/20 1753  BP: (!) 134/119  Pulse: 90  Resp: 19  SpO2: 100%

## 2020-09-07 NOTE — ED Triage Notes (Signed)
Pt reports sudden onset of dizziness and nausea x 2 hours.  States he feels like he is going to have a seizure.  History of seizure 2 years ago.  Doesn't take seizure medication.  No arm drift.

## 2020-09-07 NOTE — ED Provider Notes (Signed)
[facility] EMERGENCY DEPARTMENT Provider Note   CSN: [id number] Arrival date & time: 09/07/20  1808     History Chief Complaint  Patient presents with  . Dizziness  . Nausea    Tom Conley is a 55 y.o. male.  Patient presents to the emergency department with a chief complaint of dizziness.  He states that the symptoms started suddenly about 6 hours ago.  He describes feeling off balance and nauseated.  He also states that it felt like he was going to pass out or have a seizure.  He has had 4-5 seizures in the past, but is not medicated for this.  He states that he is having some numbness and tingling in his left lower extremity, but reports that this is not new or different.  He does have history of sciatica.  He denies any recent illnesses, denies fever, chills, or cough.  Denies chest pain or shortness of breath.  The history is provided by the patient. No language interpreter was used.       Past Medical History:  Diagnosis Date  . Back pain   . Diabetes mellitus without complication (HCC)    hypoglycemic  . Herniated disc   . Hypertension   . Seizures [facility])     Patient Active Problem List   Diagnosis Date Noted  . Vision changes 06/25/2016  . Headache 06/25/2016  . Acute hyperglycemia 06/25/2016  . Chronic back pain 06/25/2016  . Polysubstance abuse (HCC) 06/25/2016  . History of seizures 06/25/2016  . HTN (hypertension) 06/25/2016  . Right-sided headache   . Essential hypertension     Past Surgical History:  Procedure Laterality Date  . fracture arm Left 1975   ? plate on ulna       Family History  Problem Relation Age of Onset  . Diabetes Mother     Social History   Tobacco Use  . Smoking status: Current Some Day Smoker    Packs/day: 0.50    Types: Cigarettes  . Smokeless tobacco: Never Used  Vaping Use  . Vaping Use: Never used  Substance Use Topics  . Alcohol use: Yes  . Drug use: No    Home Medications Prior  to Admission medications   Medication Sig Start Date End Date Taking? Authorizing Provider  clindamycin (CLEOCIN) 300 MG capsule Take 1 capsule (300 mg total) by mouth 3 (three) times daily. 07/20/19   Mardella Layman, MD  HYDROcodone-acetaminophen (NORCO/VICODIN) 5-325 MG tablet Take 1 tablet by mouth every 6 (six) hours as needed for moderate pain or severe pain. 07/20/19   Mardella Layman, MD    Allergies    Penicillins  Review of Systems   Review of Systems  All other systems reviewed and are negative.   Physical Exam Updated Vital Signs BP 115/75   Pulse 75   Temp (!) 97.4 F (36.3 C) (Oral)   Resp 20   SpO2 94%   Physical Exam Vitals and nursing note reviewed.  Constitutional:      Appearance: He is well-developed and well-nourished.  HENT:     Head: Normocephalic and atraumatic.  Eyes:     Conjunctiva/sclera: Conjunctivae normal.  Cardiovascular:     Rate and Rhythm: Normal rate and regular rhythm.     Heart sounds: No murmur heard.   Pulmonary:     Effort: Pulmonary effort is normal. No respiratory distress.     Breath sounds: Normal breath sounds.  Abdominal:     Palpations: Abdomen  is soft.     Tenderness: There is no abdominal tenderness.  Musculoskeletal:        General: No edema.     Cervical back: Neck supple.  Skin:    General: Skin is warm and dry.  Neurological:     Mental Status: He is alert and oriented to person, place, and time.     Comments: CN III-XII intact, speech is clear, movements are goal oriented, 5/5 strength in bilateral upper and lower extremities  Psychiatric:        Mood and Affect: Mood and affect and mood normal.        Behavior: Behavior normal.     ED Results / Procedures / Treatments   Labs (all labs ordered are listed, but only abnormal results are displayed) Labs Reviewed  CBC - Abnormal; Notable for the following components:      Result Value   RBC 4.15 (*)    MCV 101.9 (*)    MCH 34.5 (*)    All other components  within normal limits  COMPREHENSIVE METABOLIC PANEL - Abnormal; Notable for the following components:   Glucose, Bld 111 (*)    All other components within normal limits  PROTIME-INR  APTT  DIFFERENTIAL    EKG EKG Interpretation  Date/Time:  Saturday September 07 2020 18:47:55 EST Ventricular Rate:  73 PR Interval:  118 QRS Duration: 90 QT Interval:  418 QTC Calculation: 460 R Axis:   92 Text Interpretation: Normal sinus rhythm Rightward axis Abnormal QRS-T angle, consider primary T wave abnormality Abnormal ECG Interpretation limited secondary to artifact Confirmed by Zadie Rhine ([id number]) on 09/07/2020 11:42:15 PM   Radiology CT HEAD WO CONTRAST  Result Date: 09/07/2020 CLINICAL DATA:  Nonspecific dizziness. EXAM: CT HEAD WITHOUT CONTRAST TECHNIQUE: Contiguous axial images were obtained from the base of the skull through the vertex without intravenous contrast. COMPARISON:  Head CT and brain MRI January 2019 FINDINGS: Brain: No intracranial hemorrhage, mass effect, or midline shift. No hydrocephalus. The basilar cisterns are patent. No evidence of territorial infarct or acute ischemia. No extra-axial or intracranial fluid collection. Vascular: No hyperdense vessel or unexpected calcification. Skull: No fracture or focal lesion. Sinuses/Orbits: Minor mucosal thickening of scattered ethmoid air cells. Mastoid air cells are clear. Slight dysconjugate gaze, typically incidental. Other: None. IMPRESSION: Negative head CT. Electronically Signed   By: Narda Rutherford M.D.   On: 09/07/2020 19:20    Procedures Procedures   Medications Ordered in ED Medications  sodium chloride flush (NS) 0.9 % injection 3 mL (has no administration in time range)  sodium chloride 0.9 % bolus 1,000 mL (has no administration in time range)    ED Course  I have reviewed the triage vital signs and the nursing notes.  Pertinent labs & imaging results that were available during my care of the patient were  reviewed by me and considered in my medical decision making (see chart for details).    MDM Rules/Calculators/A&P                          This patient complains of sudden onset dizziness with associated nausea that started about 6 hours ago., this involves an extensive number of treatment options, and is a complaint that carries with it a high risk of complications and morbidity.    Differential Dx Stroke, vertigo, dehydration, Covid, hypovolemia, hypoglycemia  Pertinent Labs I ordered, reviewed, and interpreted labs, which included CBC, CMP, PT/INR, which are all  essentially normal  Imaging Interpretation CT head ordered in triage is negative.  Based on patient's dizziness, will order MRI brain to rule out cerebral infarct.  If MRI is negative, I suspect the patient can be discharged home with PCP follow-up.  Patient was unable to tolerate MRI.  Medications I ordered medication fluids.  Reassessments After the interventions stated above, I reevaluated the patient and found patient without any further complaints.  He is able to ambulate to the bathroom.  Consultants None- Consult placed for ambulatory referral to neurology.  Plan DC with outpatient follow-up.    Final Clinical Impression(s) / ED Diagnoses Final diagnoses:  Dizziness  Lightheadedness    Rx / DC Orders ED Discharge Orders    None       Roxy Horseman, PA-C 09/08/20 0427    Gerhard Munch, MD 09/08/20 [phone]

## 2020-09-08 ENCOUNTER — Other Ambulatory Visit: Payer: Self-pay

## 2020-09-08 ENCOUNTER — Emergency Department (HOSPITAL_COMMUNITY)
Admission: EM | Admit: 2020-09-08 | Discharge: 2020-09-08 | Disposition: A | Discharge status: Home / Self Care | Payer: Self-pay | Attending: Emergency Medicine | Admitting: Emergency Medicine

## 2020-09-08 ENCOUNTER — Encounter (HOSPITAL_COMMUNITY): Payer: Self-pay

## 2020-09-08 DIAGNOSIS — Z7729 Contact with and (suspected ) exposure to other hazardous substances: Secondary | ICD-10-CM

## 2020-09-08 DIAGNOSIS — R42 Dizziness and giddiness: Secondary | ICD-10-CM | POA: Insufficient documentation

## 2020-09-08 DIAGNOSIS — I10 Essential (primary) hypertension: Secondary | ICD-10-CM | POA: Insufficient documentation

## 2020-09-08 DIAGNOSIS — F17210 Nicotine dependence, cigarettes, uncomplicated: Secondary | ICD-10-CM | POA: Insufficient documentation

## 2020-09-08 DIAGNOSIS — T5891XA Toxic effect of carbon monoxide from unspecified source, accidental (unintentional), initial encounter: Secondary | ICD-10-CM | POA: Insufficient documentation

## 2020-09-08 DIAGNOSIS — E119 Type 2 diabetes mellitus without complications: Secondary | ICD-10-CM | POA: Insufficient documentation

## 2020-09-08 LAB — CBC
HCT: 41.5 % (ref 39.0–52.0)
Hemoglobin: 13.8 g/dL (ref 13.0–17.0)
MCH: 34.5 pg — ABNORMAL HIGH (ref 26.0–34.0)
MCHC: 33.3 g/dL (ref 30.0–36.0)
MCV: 103.8 fL — ABNORMAL HIGH (ref 80.0–100.0)
Platelets: 238 10*3/uL (ref 150–400)
RBC: 4 MIL/uL — ABNORMAL LOW (ref 4.22–5.81)
RDW: 12.4 % (ref 11.5–15.5)
WBC: 6 10*3/uL (ref 4.0–10.5)
nRBC: 0 % (ref 0.0–0.2)

## 2020-09-08 LAB — COMPREHENSIVE METABOLIC PANEL
ALT: 18 U/L (ref 0–44)
AST: 21 U/L (ref 15–41)
Albumin: 3.5 g/dL (ref 3.5–5.0)
Alkaline Phosphatase: 98 U/L (ref 38–126)
Anion gap: 9 (ref 5–15)
BUN: 13 mg/dL (ref 6–20)
CO2: 27 mmol/L (ref 22–32)
Calcium: 9.1 mg/dL (ref 8.9–10.3)
Chloride: 103 mmol/L (ref 98–111)
Creatinine, Ser: 0.98 mg/dL (ref 0.61–1.24)
GFR, Estimated: >60 mL/min (ref >60)
Glucose, Bld: 105 mg/dL — ABNORMAL HIGH (ref 70–99)
Potassium: 3.5 mmol/L (ref 3.5–5.1)
Sodium: 139 mmol/L (ref 135–145)
Total Bilirubin: 0.6 mg/dL (ref 0.3–1.2)
Total Protein: 6.7 g/dL (ref 6.5–8.1)

## 2020-09-08 LAB — COOXEMETRY PANEL
Carboxyhemoglobin: 2.8 % — ABNORMAL HIGH (ref 0.5–1.5)
Methemoglobin: 0.8 % (ref 0.0–1.5)
O2 Saturation: 98.6 %
Total hemoglobin: 13.2 g/dL (ref 12.0–16.0)

## 2020-09-08 LAB — TROPONIN I (HIGH SENSITIVITY)
Troponin I (High Sensitivity): 6 ng/L (ref <18)
Troponin I (High Sensitivity): 6 ng/L (ref <18)

## 2020-09-08 NOTE — ED Notes (Signed)
Pt smells strongly of kerosene--  he has been sleeping next to the heater at his house. Dr. Bevely Palmer aware.

## 2020-09-08 NOTE — Discharge Instructions (Addendum)
Make sure to avoid the kerosine heater and stay hydrated and rest.  If you start having worsening symptoms or you get that chest pain and it won't go away return to the ER.

## 2020-09-08 NOTE — ED Notes (Signed)
Ambulated  Pt to bathroom, denies dizziness. Sister will pick him up. Educated on use of Kerosene heater and need for ventilation.

## 2020-09-08 NOTE — ED Notes (Signed)
The pt is sleeping soundly   °

## 2020-09-08 NOTE — ED Provider Notes (Signed)
[facility] EMERGENCY DEPARTMENT Provider Note   CSN: [id number] Arrival date & time: 09/08/20  0932     History Chief Complaint  Patient presents with  . Dizziness    Tom Conley is a 55 y.o. male.  Patient is a 55 year old male with a history of polysubstance abuse, seizure, hypertension and chronic back pain who is presenting today with complaint of dizziness.  Patient was evaluated overnight for similar complaint.  At that time he had lab work including a head CT all which were negative.  They wanted to do an MRI but because of claustrophobia patient refused and sent he could not tolerate it but would be willing to do an outpatient MRI.  His symptoms were resolved prior to discharge.  He reports that after discharge when he walked outside he started feeling dizzy again.  He describes it as feeling off balance but also feeling lightheaded like he might pass out.  When he returned to the triage area they noted that he was diaphoretic.  He states he had some chest pain and left arm pain as well.  He has been waiting approximately 6 hours and reports that he now does not have any symptoms.  They resolved after he went to sleep.  He does state that he gets chest pain every day and it makes his left arm contracted.  This has been present for at least a month.  The episodes come on at rest or when he is doing something and resolve spontaneously.  There is nothing he can do to make the pain go away or come on.  Not related to eating and he has had a normal appetite.  He has not noticed any leg swelling or shortness of breath.  He has no known cardiac history.  He is not taking any medications at this time.  He reports he last ate over 24 hours ago and he is very hungry and does have a history of hypoglycemia.  The history is provided by the patient and medical records.  Dizziness Associated symptoms: chest pain        Past Medical History:  Diagnosis Date  . Back pain    . Diabetes mellitus without complication (HCC)    hypoglycemic  . Herniated disc   . Hypertension   . Seizures [facility])     Patient Active Problem List   Diagnosis Date Noted  . Vision changes 06/25/2016  . Headache 06/25/2016  . Acute hyperglycemia 06/25/2016  . Chronic back pain 06/25/2016  . Polysubstance abuse (HCC) 06/25/2016  . History of seizures 06/25/2016  . HTN (hypertension) 06/25/2016  . Right-sided headache   . Essential hypertension     Past Surgical History:  Procedure Laterality Date  . fracture arm Left 1975   ? plate on ulna       Family History  Problem Relation Age of Onset  . Diabetes Mother     Social History   Tobacco Use  . Smoking status: Current Some Day Smoker    Packs/day: 0.50    Types: Cigarettes  . Smokeless tobacco: Never Used  Vaping Use  . Vaping Use: Never used  Substance Use Topics  . Alcohol use: Yes  . Drug use: No    Home Medications Prior to Admission medications   Medication Sig Start Date End Date Taking? Authorizing Provider  clindamycin (CLEOCIN) 300 MG capsule Take 1 capsule (300 mg total) by mouth 3 (three) times daily. 07/20/19   Mardella Layman,  MD  HYDROcodone-acetaminophen (NORCO/VICODIN) 5-325 MG tablet Take 1 tablet by mouth every 6 (six) hours as needed for moderate pain or severe pain. 07/20/19   Mardella Layman, MD    Allergies    Penicillins  Review of Systems   Review of Systems  Cardiovascular: Positive for chest pain.       Chest pain intermittently everyday  Musculoskeletal: Positive for back pain.       Chronic back pain for almost 10 years with radiation down the legs L>R with intermittent numbness and has to walk with a cane.  Also have left arm pain and spasm every day  Neurological: Positive for dizziness.  All other systems reviewed and are negative.   Physical Exam Updated Vital Signs BP 130/76 (BP Location: Right Arm)   Pulse 80   Temp 98.3 F (36.8 C) (Oral)   Resp 16   SpO2 96%    Physical Exam Vitals and nursing note reviewed.  Constitutional:      General: He is not in acute distress.    Appearance: Normal appearance. He is well-developed, normal weight and well-nourished.  HENT:     Head: Normocephalic and atraumatic.     Mouth/Throat:     Mouth: Oropharynx is clear and moist.  Eyes:     General: No visual field deficit.    Extraocular Movements: Extraocular movements intact and EOM normal.     Conjunctiva/sclera: Conjunctivae normal.     Pupils: Pupils are equal, round, and reactive to light.     Comments: No nystagmus  Cardiovascular:     Rate and Rhythm: Normal rate and regular rhythm.     Pulses: Normal pulses and intact distal pulses.     Heart sounds: No murmur heard.   Pulmonary:     Effort: Pulmonary effort is normal. No respiratory distress.     Breath sounds: Normal breath sounds. No wheezing or rales.  Abdominal:     General: There is no distension.     Palpations: Abdomen is soft.     Tenderness: There is no abdominal tenderness. There is no guarding or rebound.  Musculoskeletal:        General: No tenderness or edema. Normal range of motion.     Cervical back: Normal range of motion and neck supple.     Right lower leg: No edema.     Left lower leg: No edema.  Skin:    General: Skin is warm and dry.     Findings: No erythema or rash.  Neurological:     General: No focal deficit present.     Mental Status: He is alert and oriented to person, place, and time. Mental status is at baseline.     Cranial Nerves: Cranial nerves are intact. No dysarthria or facial asymmetry.     Sensory: Sensation is intact.     Motor: Motor function is intact. No weakness or pronator drift.     Coordination: Coordination is intact. Finger-Nose-Finger Test and Heel to [place] Test normal.     Gait: Gait is intact.     Comments: Pt not currently dizzy on exam.  Not reproduced with eye movements  Psychiatric:        Mood and Affect: Mood and affect and mood  normal.        Behavior: Behavior normal.        Thought Content: Thought content normal.     ED Results / Procedures / Treatments   Labs (all labs ordered are listed,  but only abnormal results are displayed) Labs Reviewed  CBC - Abnormal; Notable for the following components:      Result Value   RBC 4.00 (*)    MCV 103.8 (*)    MCH 34.5 (*)    All other components within normal limits  COMPREHENSIVE METABOLIC PANEL - Abnormal; Notable for the following components:   Glucose, Bld 105 (*)    All other components within normal limits  COOXEMETRY PANEL - Abnormal; Notable for the following components:   Carboxyhemoglobin 2.8 (*)    All other components within normal limits  TROPONIN I (HIGH SENSITIVITY)  TROPONIN I (HIGH SENSITIVITY)    EKG EKG Interpretation  Date/Time:  Sunday September 08 2020 05:31:03 EST Ventricular Rate:  70 PR Interval:  130 QRS Duration: 98 QT Interval:  424 QTC Calculation: 457 R Axis:   91 Text Interpretation: Normal sinus rhythm Rightward axis T wave abnormality, consider inferior ischemia No significant change since last tracing Confirmed by Gwyneth Sprout ([id number]) on 09/08/2020 11:02:10 AM   Radiology CT HEAD WO CONTRAST  Result Date: 09/07/2020 CLINICAL DATA:  Nonspecific dizziness. EXAM: CT HEAD WITHOUT CONTRAST TECHNIQUE: Contiguous axial images were obtained from the base of the skull through the vertex without intravenous contrast. COMPARISON:  Head CT and brain MRI January 2019 FINDINGS: Brain: No intracranial hemorrhage, mass effect, or midline shift. No hydrocephalus. The basilar cisterns are patent. No evidence of territorial infarct or acute ischemia. No extra-axial or intracranial fluid collection. Vascular: No hyperdense vessel or unexpected calcification. Skull: No fracture or focal lesion. Sinuses/Orbits: Minor mucosal thickening of scattered ethmoid air cells. Mastoid air cells are clear. Slight dysconjugate gaze, typically  incidental. Other: None. IMPRESSION: Negative head CT. Electronically Signed   By: Narda Rutherford M.D.   On: 09/07/2020 19:20    Procedures Procedures   Medications Ordered in ED Medications - No data to display  ED Course  I have reviewed the triage vital signs and the nursing notes.  Pertinent labs & imaging results that were available during my care of the patient were reviewed by me and considered in my medical decision making (see chart for details).    MDM Rules/Calculators/A&P                          Patient presenting today with complaint of dizziness and nonspecific chest pain.  He was evaluated overnight for similar symptoms.  At that time they considered an MRI to rule out stroke however on exam today patient is not displaying any strokelike symptoms.  He is not having any symptoms of dizziness currently and is difficult to assess if it is neurologic versus orthostatic.  With standing and moving the symptoms are not reproduced at this time.  Patient's EKG is not significantly changed, initial troponin was normal, labs which were rechecked after being done within the last 24 hours were unchanged.  His head CT for less than 24 hours ago was normal.  Low suspicion at this time for stroke.  Possible peripheral vertigo versus orthostasis.  Patient's chest pain is nonspecific has been present for a month or more and seems less likely to be ACS as his symptoms do not sound like classic angina.  We will get a delta troponin.  We will feed the patient.  Will ambulate to ensure his symptoms do not occur again.  Pt does smell of kerosine and does report he has been sleeping next to the kerosine heater at  home and will get a carboxyhb to ensure he is not having sx of CO2 poisoning.  Carboxyhb is mildly elevated at 2.8 but most likely from being a chronic smoker.  Delta trop is wnl.  Feel that pt is stable for d/c and will get him set up with a PCP.  Final Clinical Impression(s) / ED  Diagnoses Final diagnoses:  Dizziness  Carbon monoxide exposure    Rx / DC Orders ED Discharge Orders    None       Gwyneth Sprout, MD 09/08/20 1339

## 2020-09-08 NOTE — ED Notes (Signed)
Patient in MRI, unable to obtain VS at this time

## 2020-09-08 NOTE — ED Triage Notes (Addendum)
Pt was seen here and left around 3am. Pt states he started feeling dizzy again. Pt reports "I need an MRI but I cant do it". Pt reports I still don't feel good and the dizziness is worse.Pt is also c/o cp with left arm pain. Pt was diaphoretic when he checked back in.

## 2020-09-08 NOTE — Progress Notes (Signed)
Attempted MRI pt refused extremely claustrophobic, got ativan prior to attempting mri pt states he had to be put out completely to get an MRI.

## 2020-09-08 NOTE — ED Notes (Signed)
The pt refused the mri after he got there he wanted to be put out as in general anesthesia  I told them to bring him back here.

## 2020-09-08 NOTE — ED Notes (Signed)
To mri 

## 2021-12-05 ENCOUNTER — Ambulatory Visit (INDEPENDENT_AMBULATORY_CARE_PROVIDER_SITE_OTHER): Payer: 59 | Admitting: Family Medicine

## 2021-12-05 ENCOUNTER — Encounter: Payer: Self-pay | Admitting: Family Medicine

## 2021-12-05 VITALS — BP 125/76 | HR 95 | Resp 16 | Ht 67.0 in | Wt 140.2 lb

## 2021-12-05 DIAGNOSIS — R059 Cough, unspecified: Secondary | ICD-10-CM

## 2021-12-05 DIAGNOSIS — M5442 Lumbago with sciatica, left side: Secondary | ICD-10-CM

## 2021-12-05 DIAGNOSIS — G8929 Other chronic pain: Secondary | ICD-10-CM

## 2021-12-05 DIAGNOSIS — I10 Essential (primary) hypertension: Secondary | ICD-10-CM

## 2021-12-05 DIAGNOSIS — Z7689 Persons encountering health services in other specified circumstances: Secondary | ICD-10-CM

## 2021-12-05 DIAGNOSIS — R739 Hyperglycemia, unspecified: Secondary | ICD-10-CM | POA: Diagnosis not present

## 2021-12-05 DIAGNOSIS — F341 Dysthymic disorder: Secondary | ICD-10-CM

## 2021-12-05 LAB — POCT GLYCOSYLATED HEMOGLOBIN (HGB A1C): Hemoglobin A1C: 5.5 % (ref 4.0–5.6)

## 2021-12-05 MED ORDER — DULOXETINE HCL 20 MG PO CPEP: 20.0000 mg | ORAL_CAPSULE | Freq: Every day | ORAL | 1 refills | Status: AC

## 2021-12-05 MED ORDER — AZITHROMYCIN 250 MG PO TABS: ORAL_TABLET | ORAL | 0 refills | Status: AC

## 2021-12-05 MED ORDER — IBUPROFEN 600 MG PO TABS: 600.0000 mg | ORAL_TABLET | Freq: Three times a day (TID) | ORAL | 1 refills | Status: AC | PRN

## 2021-12-05 NOTE — Progress Notes (Signed)
? ?New Patient Office Visit ? ?Subjective   ? ?Patient ID: Tom Conley, male    DOB: 05/13/1966  Age: 56 y.o. MRN: [id number] ? ?CC:  ?Chief Complaint  ?Patient presents with  ? Establish Care  ? ? ?HPI ?Tom Conley presents to establish care and for review of chronic med issues including hypertension and elevated blood glucose.  ? ? ?Outpatient Encounter Medications as of 12/05/2021  ?Medication Sig  ? azithromycin (ZITHROMAX) 250 MG tablet Take 2 tablets on day 1, then 1 tablet daily on days 2 through 5  ? DULoxetine (CYMBALTA) 20 MG capsule Take 1 capsule (20 mg total) by mouth daily.  ? ibuprofen (ADVIL) 600 MG tablet Take 1 tablet (600 mg total) by mouth every 8 (eight) hours as needed.  ? [DISCONTINUED] clindamycin (CLEOCIN) 300 MG capsule Take 1 capsule (300 mg total) by mouth 3 (three) times daily. (Patient not taking: Reported on 12/05/2021)  ? [DISCONTINUED] HYDROcodone-acetaminophen (NORCO/VICODIN) 5-325 MG tablet Take 1 tablet by mouth every 6 (six) hours as needed for moderate pain or severe pain. (Patient not taking: Reported on 12/05/2021)  ? ?No facility-administered encounter medications on file as of 12/05/2021.  ? ? ?Past Medical History:  ?Diagnosis Date  ? Back pain   ? Diabetes mellitus without complication ([place])   ? hypoglycemic  ? Herniated disc   ? Hypertension   ? Seizures ([place])   ? ? ?Past Surgical History:  ?Procedure Laterality Date  ? fracture arm Left 1975  ? ? plate on ulna  ? ? ?Family History  ?Problem Relation Age of Onset  ? Diabetes Mother   ? ? ?Social History  ? ?Socioeconomic History  ? Marital status: Single  ?  Spouse name: Not on file  ? Number of children: Not on file  ? Years of education: Not on file  ? Highest education level: Not on file  ?Occupational History  ? Not on file  ?Tobacco Use  ? Smoking status: Some Days  ?  Packs/day: 0.50  ?  Types: Cigarettes  ? Smokeless tobacco: Never  ?Vaping Use  ? Vaping Use: Never used  ?Substance and Sexual Activity  ?  Alcohol use: Yes  ? Drug use: No  ? Sexual activity: Yes  ?  Birth control/protection: Condom  ?Other Topics Concern  ? Not on file  ?Social History Narrative  ? Not on file  ? ?Social Determinants of Health  ? ?Financial Resource Strain: Not on file  ?Food Insecurity: Not on file  ?Transportation Needs: Not on file  ?Physical Activity: Not on file  ?Stress: Not on file  ?Social Connections: Not on file  ?Intimate Partner Violence: Not on file  ? ? ?Review of Systems  ?Constitutional:  Negative for chills and fever.  ?Respiratory:  Positive for cough. Negative for shortness of breath.   ?Musculoskeletal:  Positive for back pain.  ?Psychiatric/Behavioral:  Negative for suicidal ideas. The patient is not nervous/anxious.   ?All other systems reviewed and are negative. ? ?  ? ? ?Objective   ? ?BP 125/76   Pulse 95   Resp 16   Ht 5\' 7"  (1.702 m)   Wt 140 lb 3.2 oz (63.6 kg)   SpO2 94%   BMI 21.96 kg/m?  ? ?Physical Exam ?Vitals and nursing note reviewed.  ?Constitutional:   ?   General: He is not in acute distress. ?Cardiovascular:  ?   Rate and Rhythm: Normal rate and regular rhythm.  ?Pulmonary:  ?  Effort: Pulmonary effort is normal.  ?   Breath sounds: Normal breath sounds.  ?Abdominal:  ?   Palpations: Abdomen is soft.  ?   Tenderness: There is no abdominal tenderness.  ?Neurological:  ?   General: No focal deficit present.  ?   Mental Status: He is alert and oriented to person, place, and time.  ? ? ? ?  ? ?Assessment & Plan:  ? ?1. Essential hypertension ?Appears stable with present management. continue ? ?2. Acute hyperglycemia ?A1c wnl. No further management recommended at this time ?- POCT glycosylated hemoglobin (Hb A1C) ? ?3. Dysthymia ?Cymbalta prescribed. monitor ? ?4. Chronic bilateral low back pain with left-sided sciatica ?Ibuprofen prescribed.  ? ?5. Cough, unspecified type ?Zithromax prescribed ? ?6. Encounter to establish care ? ? ? ? ?Return in about 4 weeks (around 01/02/2022) for follow up.   ? ?Becky Sax, MD ? ? ?

## 2021-12-05 NOTE — Progress Notes (Signed)
Patient is here to established care. Patient c/o pain all over the body everyday. Patient said the pain is 10/10 and he is not able to stand up straight. ?

## 2021-12-08 ENCOUNTER — Encounter: Payer: Self-pay | Admitting: Family Medicine

## 2021-12-31 ENCOUNTER — Ambulatory Visit: Payer: 59 | Admitting: Family Medicine

## 2022-08-28 DIAGNOSIS — Z6824 Body mass index (BMI) 24.0-24.9, adult: Secondary | ICD-10-CM | POA: Diagnosis not present

## 2022-08-28 DIAGNOSIS — Z708 Other sex counseling: Secondary | ICD-10-CM | POA: Diagnosis not present

## 2022-08-28 DIAGNOSIS — L209 Atopic dermatitis, unspecified: Secondary | ICD-10-CM | POA: Diagnosis not present

## 2022-08-28 DIAGNOSIS — E119 Type 2 diabetes mellitus without complications: Secondary | ICD-10-CM | POA: Diagnosis not present

## 2022-09-07 ENCOUNTER — Ambulatory Visit (HOSPITAL_COMMUNITY)
Admission: EM | Admit: 2022-09-07 | Discharge: 2022-09-07 | Disposition: A | Discharge status: Home / Self Care | Payer: 59 | Attending: Internal Medicine | Admitting: Internal Medicine

## 2022-09-07 DIAGNOSIS — A523 Neurosyphilis, unspecified: Secondary | ICD-10-CM | POA: Diagnosis not present

## 2022-09-07 DIAGNOSIS — R69 Illness, unspecified: Secondary | ICD-10-CM | POA: Diagnosis not present

## 2022-09-07 DIAGNOSIS — K6289 Other specified diseases of anus and rectum: Secondary | ICD-10-CM | POA: Diagnosis not present

## 2022-09-07 DIAGNOSIS — G510 Bell's palsy: Secondary | ICD-10-CM | POA: Insufficient documentation

## 2022-09-07 DIAGNOSIS — H539 Unspecified visual disturbance: Secondary | ICD-10-CM | POA: Insufficient documentation

## 2022-09-07 DIAGNOSIS — A530 Latent syphilis, unspecified as early or late: Secondary | ICD-10-CM | POA: Diagnosis not present

## 2022-09-07 LAB — COMPREHENSIVE METABOLIC PANEL
ALT: 20 U/L (ref 0–44)
AST: 22 U/L (ref 15–41)
Albumin: 2.9 g/dL — ABNORMAL LOW (ref 3.5–5.0)
Alkaline Phosphatase: 114 U/L (ref 38–126)
Anion gap: 5 (ref 5–15)
BUN: 8 mg/dL (ref 6–20)
CO2: 26 mmol/L (ref 22–32)
Calcium: 8.7 mg/dL — ABNORMAL LOW (ref 8.9–10.3)
Chloride: 105 mmol/L (ref 98–111)
Creatinine, Ser: 0.66 mg/dL (ref 0.61–1.24)
GFR, Estimated: >60 mL/min (ref >60)
Glucose, Bld: 97 mg/dL (ref 70–99)
Potassium: 4.5 mmol/L (ref 3.5–5.1)
Sodium: 136 mmol/L (ref 135–145)
Total Bilirubin: 0.4 mg/dL (ref 0.3–1.2)
Total Protein: 7.8 g/dL (ref 6.5–8.1)

## 2022-09-07 LAB — CBC
HCT: 36.7 % — ABNORMAL LOW (ref 39.0–52.0)
Hemoglobin: 12 g/dL — ABNORMAL LOW (ref 13.0–17.0)
MCH: 32.7 pg (ref 26.0–34.0)
MCHC: 32.7 g/dL (ref 30.0–36.0)
MCV: 100 fL (ref 80.0–100.0)
Platelets: 351 10*3/uL (ref 150–400)
RBC: 3.67 MIL/uL — ABNORMAL LOW (ref 4.22–5.81)
RDW: 12.8 % (ref 11.5–15.5)
WBC: 6 10*3/uL (ref 4.0–10.5)
nRBC: 0 % (ref 0.0–0.2)

## 2022-09-07 LAB — HIV ANTIBODY (ROUTINE TESTING W REFLEX): HIV Screen 4th Generation wRfx: NONREACTIVE

## 2022-09-07 MED ORDER — HYDROCODONE-ACETAMINOPHEN 5-325 MG PO TABS: 1.0000 | ORAL_TABLET | Freq: Four times a day (QID) | ORAL | 0 refills | Status: AC | PRN

## 2022-09-07 NOTE — ED Triage Notes (Signed)
Pt initially is here for treatment for syphilis, however pt presents a concerned about bells palsy, and states he has a rash on buttocks that has been draining and painful x3wks pt wrists has been swollen and he feels weak

## 2022-09-07 NOTE — ED Provider Notes (Signed)
[facility]    CSN: [id number] Arrival date & time: 09/07/22  1235      History   Chief Complaint Chief Complaint  Patient presents with  . bells pasly  . STD TREATMENT    HPI Tom Conley is a 57 y.o. male.   Patient is a 56 year old male with significant PMH for HTN, type 2 diabetes mellitus, seizures, and polysubstance abuse presenting to urgent care for evaluation of multiple complaints. Symptoms began 1 month ago when patient developed Bell's Palsy-like symptoms to the left side of the face. He has a history of Bell's Palsy (remote hx from high school) and did not seek medical care for symptoms due to history of similar problem. He is unable to fully close his left eye and states he has had to drink from a straw due to drooling from the right side of the mouth when eating/drinking. No recent trauma/injury to the face, history of CVA, history of atrial fibrillation, fever/chills, or rash to the face. He states his Bell's Palsy symptoms have improved sine onset and he has regained some facial function. He has normal sensation to his face bilaterally. Around the same time, he developed arthralgias to the bilateral shoulders that have since improved but he is now experiencing arthralgias to the bilateral wrists/forearms without one-sided weakness or numbness/tingling to the extremities.  3 weeks ago, he developed painful rash to the buttocks and the perirectal area that he describes as a burning, tingling, and sharp pain. Pain to the rash is worse when sitting on buttocks and when defecating due to swelling. Approximately 1 week ago, he developed photophobia mostly when in direct sunlight and started to see "spots in his peripheral vision". Spots in vision are sometimes blue and sometimes red depending on the object he is looking at, but aren't always there. Denies redness, swelling, and drainage to eyes. He does not wear contacts or glasses for vision correction. Vision  changes are to both eyes. Denies headache, nausea, vomiting, and neck pain. He had chicken pox as a child and is not vaccinated against shingles. Denies recent new medications or vaccines. History of polysubstance abuse, denies current drug/alcohol use.   He went to [facility] for evaluation 10 days ago where he was tested for HIV and syphilis. He was prescribed bactrim antibiotic and took this twice a day for 10 days (last dose/day is today). He states bactrim did not help with symptoms and rash to the rectal area has worsened over the last 10 days. Patient states his syphilis test came back positive from [facility] but he was not told any other information. He has never tested positive for syphilis in the past to his knowledge and has never had a lesion/rash to the penis or rash to the hands. He does state he had a generalized rash to his body a few weeks ago but attributes this to bug bites. Generalized rash to body has since healed, but he states "you can still see where the rash was" on the arms and abdomen. He has not been sexually active since November 2023 and participates in sexual intercourse with males and females. Participates in rectal intercourse as well. States HIV testing was negative when performed recently. He is not experiencing any penile symptoms. He has been applying triple antibiotic ointment to the rectal area without relief of symptoms. He has also been taking ibuprofen for pain without relief.     Past Medical History:  Diagnosis Date  .  Back pain   . Diabetes mellitus without complication ([place])    hypoglycemic  . Herniated disc   . Hypertension   . Seizures [facility])     Patient Active Problem List   Diagnosis Date Noted  . Vision changes 06/25/2016  . Headache 06/25/2016  . Acute hyperglycemia 06/25/2016  . Chronic back pain 06/25/2016  . Polysubstance abuse ([place]) 06/25/2016  . History of seizures 06/25/2016  . HTN (hypertension) 06/25/2016  . Right-sided headache   . Essential  hypertension     Past Surgical History:  Procedure Laterality Date  . fracture arm Left 1975   ? plate on ulna       Home Medications    Prior to Admission medications   Medication Sig Start Date End Date Taking? Authorizing Provider  HYDROcodone-acetaminophen (NORCO/VICODIN) 5-325 MG tablet Take 1 tablet by mouth every 6 (six) hours as needed for up to 2 days. 09/07/22 09/09/22 Yes StanhopeStasia Cavalier, FNP  hydrocortisone valerate ointment (WEST-CORT) 0.2 % Apply topically. 08/28/22  Yes [provider]  mupirocin ointment (BACTROBAN) 2 % SMARTSIG:1 Application Topical 2-3 Times Daily 08/28/22  Yes [provider]  predniSONE (DELTASONE) 10 MG tablet Take by mouth. 08/28/22  Yes [provider]  sulfamethoxazole-trimethoprim (BACTRIM DS) 800-160 MG tablet SMARTSIG:1 Tablet(s) By Mouth Every 12 Hours 08/28/22  Yes [provider]  DULoxetine (CYMBALTA) 20 MG capsule Take 1 capsule (20 mg total) by mouth daily. 12/05/21   Dorna Mai, MD  ibuprofen (ADVIL) 600 MG tablet Take 1 tablet (600 mg total) by mouth every 8 (eight) hours as needed. 12/05/21   Dorna Mai, MD    Family History Family History  Problem Relation Age of Onset  . Diabetes Mother     Social History Social History   Tobacco Use  . Smoking status: Some Days    Packs/day: 0.50    Types: Cigarettes  . Smokeless tobacco: Never  Vaping Use  . Vaping Use: Never used  Substance Use Topics  . Alcohol use: Yes  . Drug use: No     Allergies   Penicillins   Review of Systems Review of Systems Per HPI  Physical Exam Triage Vital Signs ED Triage Vitals  Enc Vitals Group     BP 09/07/22 1506 (!) 135/93     Pulse Rate 09/07/22 1506 87     Resp 09/07/22 1506 16     Temp 09/07/22 1506 98.3 F (36.8 C)     Temp Source 09/07/22 1506 Oral     SpO2 09/07/22 1506 98 %     Weight --      Height --      Head Circumference --      Peak Flow --      Pain Score 09/07/22  1502 10     Pain Loc --      Pain Edu? --      Excl. in [place]? --    No data found.  Updated Vital Signs BP (!) 135/93 (BP Location: Right Arm)   Pulse 87   Temp 98.3 F (36.8 C) (Oral)   Resp 16   SpO2 98%   Visual Acuity Right Eye Distance:   Left Eye Distance:   Bilateral Distance:    Right Eye Near:   Left Eye Near:    Bilateral Near:     Physical Exam Vitals and nursing note reviewed. Exam conducted with a chaperone present Luellen Pucker, Therapist, sports).  Constitutional:      Appearance: He  is not ill-appearing or toxic-appearing.  HENT:     Head: Normocephalic and atraumatic.     Right Ear: Hearing, tympanic membrane, ear canal and external ear normal.     Left Ear: Hearing, tympanic membrane, ear canal and external ear normal.     Nose: Nose normal.     Mouth/Throat:     Lips: Pink.     Mouth: Mucous membranes are moist.     Pharynx: No posterior oropharyngeal erythema.  Eyes:     General: Lids are normal. Vision grossly intact. Gaze aligned appropriately. No visual field deficit.       Right eye: No discharge.        Left eye: Discharge present.    Extraocular Movements: Extraocular movements intact.     Conjunctiva/sclera: Conjunctivae normal.     Right eye: Right conjunctiva is not injected. No chemosis, exudate or hemorrhage.    Left eye: Left conjunctiva is not injected. No chemosis, exudate or hemorrhage.    Pupils: Pupils are equal, round, and reactive to light.     Comments: EOMs intact without pain or dizziness elicited. Clear drainage present to the left eye. Unable to fully close left eye.   Neck:     Comments: Sensation intact bilaterally. Cardiovascular:     Rate and Rhythm: Normal rate and regular rhythm.     Heart sounds: Normal heart sounds, S1 normal and S2 normal.  Pulmonary:     Effort: Pulmonary effort is normal. No respiratory distress.     Breath sounds: Normal breath sounds and air entry. No decreased breath sounds, wheezing, rhonchi or rales.      Comments: Clear to auscultation bilaterally.  Genitourinary:    Penis: Normal.      Testes: Normal.     Rectum: Tenderness present.    Musculoskeletal:     Cervical back: Normal range of motion and neck supple. No tenderness.     Right lower leg: No edema.     Left lower leg: No edema.  Skin:    General: Skin is warm and dry.     Capillary Refill: Capillary refill takes less than 2 seconds.     Findings: No rash.  Neurological:     General: No focal deficit present.     Mental Status: He is alert and oriented to person, place, and time. Mental status is at baseline.     Cranial Nerves: Cranial nerve deficit and facial asymmetry present. No dysarthria.     Sensory: Sensation is intact.     Motor: Motor function is intact. No weakness, tremor, atrophy, abnormal muscle tone or pronator drift.     Coordination: Coordination is intact. Romberg sign negative. Coordination normal. Finger-Nose-Finger Test normal.     Gait: Gait is intact.     Comments: Cranial nerve 7 deficit with left-sided facial droop and inability to fully close the left eye.  Sensation intact to bilateral face, arms, and legs. Moves all 4 extremities with normal coordination voluntarily. Negative romberg.   Psychiatric:        Mood and Affect: Mood normal.        Speech: Speech normal.        Behavior: Behavior normal. Behavior is cooperative.        Thought Content: Thought content normal.        Judgment: Judgment normal.      UC Treatments / Results  Labs (all labs ordered are listed, but only abnormal results are displayed) Labs Reviewed  CBC -  Abnormal; Notable for the following components:      Result Value   RBC 3.67 (*)    Hemoglobin 12.0 (*)    HCT 36.7 (*)    All other components within normal limits  COMPREHENSIVE METABOLIC PANEL - Abnormal; Notable for the following components:   Calcium 8.7 (*)    Albumin 2.9 (*)    All other components within normal limits  HIV ANTIBODY (ROUTINE TESTING W  REFLEX)  RPR    EKG   Radiology No results found.  Procedures Procedures (including critical care time)  Medications Ordered in UC Medications - No data to display  Initial Impression / Assessment and Plan / UC Course  I have reviewed the triage vital signs and the nursing notes.  Pertinent labs & imaging results that were available during my care of the patient were reviewed by me and considered in my medical decision making (see chart for details).   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 Anogenital infection concerning for possible HPV/HIV. Neurologic evaluation today is stable, low suspicion for CVA.    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   I have drawn blood work today to evaluate for other infections in your body.   I would like to wait until the blood work comes back to decide on a good treatment plan.  For now, continue using ibuprofen as needed for pain to the buttocks.  I will call you with the results of your blood work and to go over the plan once we have more information.  The plan may involve sending you to a specialist called "infectious disease". We will discuss this further when the blood work comes back.      ED Prescriptions     Medication Sig Dispense Auth. Provider   HYDROcodone-acetaminophen (NORCO/VICODIN) 5-325 MG tablet Take 1 tablet by mouth every 6 (six) hours as needed for up to 2 days. 8 tablet Carlisle Beers, FNP      I have reviewed the PDMP during this encounter.

## 2022-09-07 NOTE — Discharge Instructions (Addendum)
I am concerned that you may have a very severe type of syphilis infection that may be causing your symptoms.   I have drawn blood work today to evaluate for other infections in your body.   I would like to wait until the blood work comes back to decide on a good treatment plan.  For now, continue using ibuprofen as needed for pain to the buttocks.  I will call you with the results of your blood work and to go over the plan once we have more information.  The plan may involve sending you to a specialist called "infectious disease". We will discuss this further when the blood work comes back.

## 2022-09-08 LAB — RPR
RPR Ser Ql: REACTIVE — AB
RPR Titer: 1:128 {titer}

## 2022-09-09 ENCOUNTER — Telehealth (HOSPITAL_COMMUNITY): Payer: Self-pay | Admitting: Emergency Medicine

## 2022-09-09 LAB — T.PALLIDUM AB, TOTAL: T Pallidum Abs: REACTIVE — AB

## 2022-09-09 NOTE — Telephone Encounter (Signed)
Per Dr. Mannie Stabile, patient will need Bicillin treatment, and otherwise labs unchanged. Per protocol, he will need treatment with 2.4 million units of Bicillin for positive Syphilis.   Attempted to reach patient x 1, LVM

## 2022-09-10 ENCOUNTER — Ambulatory Visit (HOSPITAL_COMMUNITY)
Admission: EM | Admit: 2022-09-10 | Discharge: 2022-09-10 | Disposition: A | Discharge status: Home / Self Care | Payer: 59 | Attending: Family Medicine | Admitting: Family Medicine

## 2022-09-10 ENCOUNTER — Telehealth (HOSPITAL_COMMUNITY): Payer: Self-pay | Admitting: Emergency Medicine

## 2022-09-10 ENCOUNTER — Encounter (HOSPITAL_COMMUNITY): Payer: Self-pay

## 2022-09-10 DIAGNOSIS — A539 Syphilis, unspecified: Secondary | ICD-10-CM | POA: Diagnosis not present

## 2022-09-10 DIAGNOSIS — R69 Illness, unspecified: Secondary | ICD-10-CM | POA: Diagnosis not present

## 2022-09-10 MED ORDER — DOXYCYCLINE HYCLATE 100 MG PO CAPS: 100.0000 mg | ORAL_CAPSULE | Freq: Two times a day (BID) | ORAL | 0 refills | Status: AC

## 2022-09-10 NOTE — Telephone Encounter (Signed)
Infectious Disease referral, RPR with titer of 1:128.   Patient aware

## 2022-09-10 NOTE — Discharge Instructions (Signed)
When you see the infectious disease provider, you can discuss whether or not to have the penicillin shots for syphilis.

## 2022-09-10 NOTE — ED Triage Notes (Signed)
Patient here for treatment of syphilis.

## 2022-09-10 NOTE — ED Provider Notes (Signed)
Tom Conley   [id number] 09/10/22 Arrival Time: 1937  ASSESSMENT & PLAN:  1. Syphilis    Reports PCN allergy - hives many years ago. No respiratory issues. Still is uncomfortable with trying Bicillin IM with Rx prednisone to treat hives.  Discharge Instructions    When you see the infectious disease provider, you can discuss whether or not to have the penicillin shots for syphilis.  Discharge Medication List as of 09/10/2022  7:59 PM     START taking these medications   Details  doxycycline (VIBRAMYCIN) 100 MG capsule Take 1 capsule (100 mg total) by mouth 2 (two) times daily., Starting Thu 09/10/2022, Normal        Reviewed expectations re: course of current medical issues. Questions answered. Outlined signs and symptoms indicating need for more acute intervention. Understanding verbalized. After Visit Summary given.   SUBJECTIVE: History from: Patient. Tom Conley is a 57 y.o. male. 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 ID referral has been placed. Here for Bicillin. See A/P above.   OBJECTIVE:  Vitals:   09/10/22 1949  BP: (!) 170/91  Pulse: (!) 115  Resp: 20  Temp: 99.1 F (37.3 C)  TempSrc: Oral  SpO2: 97%    General appearance: alert; no distress Labs: Results for orders placed or performed during the hospital encounter of 09/07/22  HIV Antibody (routine testing w rflx)  Result Value Ref Range   HIV Screen 4th Generation wRfx Non Reactive Non Reactive  RPR  Result Value Ref Range   RPR Ser Ql Reactive (A) NON REACTIVE   RPR Titer 1:128   CBC  Result Value Ref Range   WBC 6.0 4.0 - 10.5 K/uL   RBC 3.67 (L) 4.22 - 5.81 MIL/uL   Hemoglobin 12.0 (L) 13.0 - 17.0 g/dL   HCT 36.7 (L) 39.0 - 52.0 %   MCV 100.0 80.0 - 100.0 fL   MCH 32.7 26.0 - 34.0 pg   MCHC 32.7 30.0 - 36.0 g/dL   RDW 12.8 11.5 - 15.5 %   Platelets 351 150 - 400 K/uL   nRBC 0.0 0.0 - 0.2 %  Comprehensive metabolic panel  Result Value Ref Range   Sodium  136 135 - 145 mmol/L   Potassium 4.5 3.5 - 5.1 mmol/L   Chloride 105 98 - 111 mmol/L   CO2 26 22 - 32 mmol/L   Glucose, Bld 97 70 - 99 mg/dL   BUN 8 6 - 20 mg/dL   Creatinine, Ser 0.66 0.61 - 1.24 mg/dL   Calcium 8.7 (L) 8.9 - 10.3 mg/dL   Total Protein 7.8 6.5 - 8.1 g/dL   Albumin 2.9 (L) 3.5 - 5.0 g/dL   AST 22 15 - 41 U/L   ALT 20 0 - 44 U/L   Alkaline Phosphatase 114 38 - 126 U/L   Total Bilirubin 0.4 0.3 - 1.2 mg/dL   GFR, Estimated >60 >60 mL/min   Anion gap 5 5 - 15  T.pallidum Ab, Total  Result Value Ref Range   T Pallidum Abs Reactive (A) Non Reactive   Labs Reviewed - No data to display  Imaging: No results found.  Allergies  Allergen Reactions   Penicillins Hives    Past Medical History:  Diagnosis Date   Back pain    Diabetes mellitus without complication ([place])    hypoglycemic   Herniated disc    Hypertension    Seizures ([place])    Social History   Socioeconomic History  Marital status: Single    Spouse name: Not on file   Number of children: Not on file   Years of education: Not on file   Highest education level: Not on file  Occupational History   Not on file  Tobacco Use   Smoking status: Some Days    Packs/day: 0.50    Types: Cigarettes   Smokeless tobacco: Never  Vaping Use   Vaping Use: Never used  Substance and Sexual Activity   Alcohol use: Yes   Drug use: No   Sexual activity: Yes    Birth control/protection: Condom  Other Topics Concern   Not on file  Social History Narrative   Not on file   Social Determinants of Health   Financial Resource Strain: Not on file  Food Insecurity: Not on file  Transportation Needs: Not on file  Physical Activity: Not on file  Stress: Not on file  Social Connections: Not on file  Intimate Partner Violence: Not on file   Family History  Problem Relation Age of Onset   Diabetes Mother    Past Surgical History:  Procedure Laterality Date   fracture arm Left 1975   ? plate on ulna      Vanessa Kick, MD 09/10/22 2012

## 2022-09-23 ENCOUNTER — Encounter: Payer: Self-pay | Admitting: Family

## 2022-09-23 NOTE — Progress Notes (Signed)
  This encounter was created in error - please disregard. No show
